# Patient Record
Sex: Female | Born: 1985 | Race: White | Hispanic: No | Marital: Married | Smoking: Never smoker
Health system: Southern US, Community
[De-identification: ages and names within clinical notes are randomized; demographics above are authoritative.]

## PROBLEM LIST (undated history)

## (undated) DIAGNOSIS — J189 Pneumonia, unspecified organism: Secondary | ICD-10-CM

## (undated) HISTORY — PX: WISDOM TOOTH EXTRACTION: SHX21

## (undated) HISTORY — PX: TONSILLECTOMY: SUR1361

---
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
## 2012-12-07 NOTE — L&D Delivery Note (Signed)
Delivery Note At 3:27 PM a viable and healthy female was delivered via  (Presentation:LOA  ).  APGAR: 9, 9; weight pending.   Placenta status: spontaneous and intact.  Cord:  with the following complications: none.  Cord pH: na  Anesthesia:  epidural Episiotomy: none Lacerations: second and left vaginal/vulvar Suture Repair: 2.0 vicryl rapide Est. Blood Loss (mL): 200  Mom to postpartum.  Baby to nursery-stable.  Takiera Mayo J 06/23/2013, 3:40 PM

## 2012-12-21 LAB — OB RESULTS CONSOLE RPR: RPR: NONREACTIVE

## 2012-12-21 LAB — OB RESULTS CONSOLE ANTIBODY SCREEN: Antibody Screen: NEGATIVE

## 2012-12-21 LAB — OB RESULTS CONSOLE ABO/RH: RH Type: POSITIVE

## 2012-12-21 LAB — OB RESULTS CONSOLE HIV ANTIBODY (ROUTINE TESTING): HIV: NONREACTIVE

## 2013-05-26 LAB — OB RESULTS CONSOLE GBS: GBS: POSITIVE

## 2013-06-23 ENCOUNTER — Encounter (HOSPITAL_COMMUNITY): Payer: Self-pay | Admitting: Anesthesiology

## 2013-06-23 ENCOUNTER — Inpatient Hospital Stay (HOSPITAL_COMMUNITY)
Admission: AD | Admit: 2013-06-23 | Discharge: 2013-06-25 | DRG: 775 | Disposition: A | Discharge status: Home / Self Care | Payer: PRIVATE HEALTH INSURANCE | Source: Ambulatory Visit | Attending: Obstetrics and Gynecology | Admitting: Obstetrics and Gynecology

## 2013-06-23 ENCOUNTER — Inpatient Hospital Stay (HOSPITAL_COMMUNITY): Payer: PRIVATE HEALTH INSURANCE | Admitting: Anesthesiology

## 2013-06-23 ENCOUNTER — Encounter (HOSPITAL_COMMUNITY): Payer: Self-pay | Admitting: *Deleted

## 2013-06-23 LAB — RPR: RPR Ser Ql: NONREACTIVE

## 2013-06-23 LAB — CBC
HCT: 38.1 % (ref 36.0–46.0)
Hemoglobin: 12.8 g/dL (ref 12.0–15.0)
RBC: 4.57 MIL/uL (ref 3.87–5.11)

## 2013-06-23 MED ORDER — LACTATED RINGERS IV SOLN: 500.0000 mL | INTRAVENOUS | Status: DC | PRN

## 2013-06-23 MED ORDER — PRENATAL MULTIVITAMIN CH
1.0000 | ORAL_TABLET | Freq: Every day | ORAL | Status: DC
  Administered 2013-06-24 – 2013-06-25 (×2): 1 via ORAL
  Filled 2013-06-23 (×2): qty 1

## 2013-06-23 MED ORDER — ZOLPIDEM TARTRATE 5 MG PO TABS: 5.0000 mg | ORAL_TABLET | Freq: Every evening | ORAL | Status: DC | PRN

## 2013-06-23 MED ORDER — OXYTOCIN BOLUS FROM INFUSION: 500.0000 mL | INTRAVENOUS | Status: DC

## 2013-06-23 MED ORDER — ONDANSETRON HCL 4 MG PO TABS: 4.0000 mg | ORAL_TABLET | ORAL | Status: DC | PRN

## 2013-06-23 MED ORDER — WITCH HAZEL-GLYCERIN EX PADS: 1.0000 "application " | MEDICATED_PAD | CUTANEOUS | Status: DC | PRN

## 2013-06-23 MED ORDER — TETANUS-DIPHTH-ACELL PERTUSSIS 5-2.5-18.5 LF-MCG/0.5 IM SUSP: 0.5000 mL | Freq: Once | INTRAMUSCULAR | Status: DC

## 2013-06-23 MED ORDER — FENTANYL 2.5 MCG/ML BUPIVACAINE 1/10 % EPIDURAL INFUSION (WH - ANES)
14.0000 mL/h | INTRAMUSCULAR | Status: DC | PRN
  Filled 2013-06-23: qty 125

## 2013-06-23 MED ORDER — EPHEDRINE 5 MG/ML INJ
10.0000 mg | INTRAVENOUS | Status: DC | PRN
  Filled 2013-06-23: qty 2
  Filled 2013-06-23: qty 4

## 2013-06-23 MED ORDER — PHENYLEPHRINE 40 MCG/ML (10ML) SYRINGE FOR IV PUSH (FOR BLOOD PRESSURE SUPPORT)
80.0000 ug | PREFILLED_SYRINGE | INTRAVENOUS | Status: DC | PRN
  Filled 2013-06-23: qty 2

## 2013-06-23 MED ORDER — PENICILLIN G POTASSIUM 5000000 UNITS IJ SOLR
2.5000 10*6.[IU] | INTRAVENOUS | Status: DC
  Administered 2013-06-23: 2.5 10*6.[IU] via INTRAVENOUS
  Filled 2013-06-23 (×5): qty 2.5

## 2013-06-23 MED ORDER — PHENYLEPHRINE 40 MCG/ML (10ML) SYRINGE FOR IV PUSH (FOR BLOOD PRESSURE SUPPORT)
80.0000 ug | PREFILLED_SYRINGE | INTRAVENOUS | Status: DC | PRN
  Filled 2013-06-23: qty 2
  Filled 2013-06-23: qty 5

## 2013-06-23 MED ORDER — EPHEDRINE 5 MG/ML INJ
10.0000 mg | INTRAVENOUS | Status: DC | PRN
  Filled 2013-06-23: qty 2

## 2013-06-23 MED ORDER — LACTATED RINGERS IV SOLN
INTRAVENOUS | Status: DC
  Administered 2013-06-23: 07:00:00 via INTRAVENOUS

## 2013-06-23 MED ORDER — LIDOCAINE HCL (PF) 1 % IJ SOLN
INTRAMUSCULAR | Status: DC | PRN
  Administered 2013-06-23 (×2): 4 mL

## 2013-06-23 MED ORDER — ACETAMINOPHEN 325 MG PO TABS: 650.0000 mg | ORAL_TABLET | ORAL | Status: DC | PRN

## 2013-06-23 MED ORDER — DIBUCAINE 1 % RE OINT: 1.0000 "application " | TOPICAL_OINTMENT | RECTAL | Status: DC | PRN

## 2013-06-23 MED ORDER — LACTATED RINGERS IV SOLN
500.0000 mL | Freq: Once | INTRAVENOUS | Status: AC
  Administered 2013-06-23: 500 mL via INTRAVENOUS

## 2013-06-23 MED ORDER — CITRIC ACID-SODIUM CITRATE 334-500 MG/5ML PO SOLN: 30.0000 mL | ORAL | Status: DC | PRN

## 2013-06-23 MED ORDER — IBUPROFEN 600 MG PO TABS
600.0000 mg | ORAL_TABLET | Freq: Four times a day (QID) | ORAL | Status: DC
  Administered 2013-06-24 – 2013-06-25 (×6): 600 mg via ORAL
  Filled 2013-06-23 (×6): qty 1

## 2013-06-23 MED ORDER — SENNOSIDES-DOCUSATE SODIUM 8.6-50 MG PO TABS
2.0000 | ORAL_TABLET | Freq: Every day | ORAL | Status: DC
  Administered 2013-06-23 – 2013-06-24 (×2): 2 via ORAL

## 2013-06-23 MED ORDER — FENTANYL 2.5 MCG/ML BUPIVACAINE 1/10 % EPIDURAL INFUSION (WH - ANES)
INTRAMUSCULAR | Status: DC | PRN
  Administered 2013-06-23: 14 mL/h via EPIDURAL

## 2013-06-23 MED ORDER — PENICILLIN G POTASSIUM 5000000 UNITS IJ SOLR
5.0000 10*6.[IU] | Freq: Once | INTRAVENOUS | Status: AC
  Administered 2013-06-23: 5 10*6.[IU] via INTRAVENOUS
  Filled 2013-06-23: qty 5

## 2013-06-23 MED ORDER — BUTORPHANOL TARTRATE 1 MG/ML IJ SOLN
1.0000 mg | INTRAMUSCULAR | Status: DC | PRN
  Administered 2013-06-23: 1 mg via INTRAVENOUS
  Filled 2013-06-23: qty 1

## 2013-06-23 MED ORDER — DIPHENHYDRAMINE HCL 25 MG PO CAPS: 25.0000 mg | ORAL_CAPSULE | Freq: Four times a day (QID) | ORAL | Status: DC | PRN

## 2013-06-23 MED ORDER — LIDOCAINE HCL (PF) 1 % IJ SOLN
30.0000 mL | INTRAMUSCULAR | Status: DC | PRN
  Filled 2013-06-23 (×2): qty 30

## 2013-06-23 MED ORDER — BENZOCAINE-MENTHOL 20-0.5 % EX AERO
1.0000 "application " | INHALATION_SPRAY | CUTANEOUS | Status: DC | PRN
  Administered 2013-06-24: 1 via TOPICAL
  Filled 2013-06-23: qty 56

## 2013-06-23 MED ORDER — OXYTOCIN 40 UNITS IN LACTATED RINGERS INFUSION - SIMPLE MED
62.5000 mL/h | INTRAVENOUS | Status: DC
  Administered 2013-06-23: 62.5 mL/h via INTRAVENOUS
  Filled 2013-06-23: qty 1000

## 2013-06-23 MED ORDER — FLEET ENEMA 7-19 GM/118ML RE ENEM: 1.0000 | ENEMA | RECTAL | Status: DC | PRN

## 2013-06-23 MED ORDER — IBUPROFEN 600 MG PO TABS
600.0000 mg | ORAL_TABLET | Freq: Four times a day (QID) | ORAL | Status: DC | PRN
  Administered 2013-06-23: 600 mg via ORAL
  Filled 2013-06-23: qty 1

## 2013-06-23 MED ORDER — DIPHENHYDRAMINE HCL 50 MG/ML IJ SOLN: 12.5000 mg | INTRAMUSCULAR | Status: DC | PRN

## 2013-06-23 MED ORDER — OXYCODONE-ACETAMINOPHEN 5-325 MG PO TABS: 1.0000 | ORAL_TABLET | ORAL | Status: DC | PRN

## 2013-06-23 MED ORDER — LANOLIN HYDROUS EX OINT: TOPICAL_OINTMENT | CUTANEOUS | Status: DC | PRN

## 2013-06-23 MED ORDER — METHYLERGONOVINE MALEATE 0.2 MG PO TABS: 0.2000 mg | ORAL_TABLET | ORAL | Status: DC | PRN

## 2013-06-23 MED ORDER — METHYLERGONOVINE MALEATE 0.2 MG/ML IJ SOLN: 0.2000 mg | INTRAMUSCULAR | Status: DC | PRN

## 2013-06-23 MED ORDER — ONDANSETRON HCL 4 MG/2ML IJ SOLN: 4.0000 mg | Freq: Four times a day (QID) | INTRAMUSCULAR | Status: DC | PRN

## 2013-06-23 MED ORDER — SIMETHICONE 80 MG PO CHEW: 80.0000 mg | CHEWABLE_TABLET | ORAL | Status: DC | PRN

## 2013-06-23 MED ORDER — ONDANSETRON HCL 4 MG/2ML IJ SOLN: 4.0000 mg | INTRAMUSCULAR | Status: DC | PRN

## 2013-06-23 NOTE — Anesthesia Procedure Notes (Signed)
Epidural Patient location during procedure: OB Start time: 06/23/2013 8:02 AM  Staffing Anesthesiologist: Labella Zahradnik A. Performed by: anesthesiologist   Preanesthetic Checklist Completed: patient identified, site marked, surgical consent, pre-op evaluation, timeout performed, IV checked, risks and benefits discussed and monitors and equipment checked  Epidural Patient position: sitting Prep: site prepped and draped and DuraPrep Patient monitoring: continuous pulse ox and blood pressure Approach: midline Injection technique: LOR air  Needle:  Needle type: Tuohy  Needle gauge: 17 G Needle length: 9 cm and 9 Needle insertion depth: 4 cm Catheter type: closed end flexible Catheter size: 19 Gauge Catheter at skin depth: 9 cm Test dose: negative and Other  Assessment Events: blood not aspirated, injection not painful, no injection resistance, negative IV test and no paresthesia  Additional Notes Patient identified. Risks and benefits discussed including failed block, incomplete  Pain control, post dural puncture headache, nerve damage, paralysis, blood pressure Changes, nausea, vomiting, reactions to medications-both toxic and allergic and post Partum back pain. All questions were answered. Patient expressed understanding and wished to proceed. Sterile technique was used throughout procedure. Epidural site was Dressed with sterile barrier dressing. No paresthesias, signs of intravascular injection Or signs of intrathecal spread were encountered.  Patient was more comfortable after the epidural was dosed. Please see RN's note for documentation of vital signs and FHR which are stable.

## 2013-06-23 NOTE — Progress Notes (Signed)
Jasmine Larson is a 27 y.o. G1P0 at [redacted]w[redacted]d by LMP admitted for active labor  Subjective: Uncomfortable  Objective: BP 134/63  Pulse 70  Temp(Src) 97.4 F (36.3 C) (Oral)  Resp 20  Ht 5\' 5"  (1.651 m)  Wt 93.441 kg (206 lb)  BMI 34.28 kg/m2  LMP 09/22/2012      FHT:  FHR: 155 bpm, variability: moderate,  accelerations:  Present,  decelerations:  Absent UC:   regular, every 3-5 minutes SVE:   Dilation: 4 Effacement (%): 80 Station: -2 Exam by:: B Mosca  Labs: Lab Results  Component Value Date   WBC 15.0* 06/23/2013   HGB 12.8 06/23/2013   HCT 38.1 06/23/2013   MCV 83.4 06/23/2013   PLT 199 06/23/2013    Assessment / Plan: Spontaneous labor, progressing normally  Labor: Progressing normally Preeclampsia:  labs stable Fetal Wellbeing:  Category I Pain Control:  Labor support without medications I/D:  n/a Anticipated MOD:  NSVD  Jasmine Larson J 06/23/2013, 7:10 AM

## 2013-06-23 NOTE — Anesthesia Preprocedure Evaluation (Signed)
Anesthesia Evaluation  Patient identified by MRN, date of birth, ID band Patient awake    Reviewed: Allergy & Precautions, H&P , NPO status , Patient's Chart, lab work & pertinent test results  Airway Mallampati: III TM Distance: >3 FB Neck ROM: full    Dental no notable dental hx. (+) Teeth Intact   Pulmonary neg pulmonary ROS,  breath sounds clear to auscultation  Pulmonary exam normal       Cardiovascular negative cardio ROS  Rhythm:regular Rate:Normal     Neuro/Psych negative neurological ROS  negative psych ROS   GI/Hepatic Neg liver ROS, GERD-  Medicated and Controlled,  Endo/Other  Obesity   Renal/GU negative Renal ROS  negative genitourinary   Musculoskeletal   Abdominal Normal abdominal exam  (+)   Peds  Hematology negative hematology ROS (+)   Anesthesia Other Findings   Reproductive/Obstetrics (+) Pregnancy                           Anesthesia Physical Anesthesia Plan  ASA: II  Anesthesia Plan: Epidural   Post-op Pain Management:    Induction:   Airway Management Planned:   Additional Equipment:   Intra-op Plan:   Post-operative Plan:   Informed Consent: I have reviewed the patients History and Physical, chart, labs and discussed the procedure including the risks, benefits and alternatives for the proposed anesthesia with the patient or authorized representative who has indicated his/her understanding and acceptance.   Dental Advisory Given  Plan Discussed with: Anesthesiologist  Anesthesia Plan Comments:         Anesthesia Quick Evaluation

## 2013-06-23 NOTE — Progress Notes (Signed)
Jasmine Larson is a 27 y.o. G1P0 at [redacted]w[redacted]d by LMP admitted for active labor  Subjective: Pushing well  Objective: BP 133/72  Pulse 92  Temp(Src) 99.5 F (37.5 C) (Axillary)  Resp 18  Ht 5\' 5"  (1.651 m)  Wt 93.441 kg (206 lb)  BMI 34.28 kg/m2  LMP 09/22/2012      FHT:  FHR: 135 bpm, variability: moderate,  accelerations:  Present,  decelerations:  Absent UC:   regular, every 2-3 minutes SVE:   10/vtx/+2  Labs: Lab Results  Component Value Date   WBC 15.0* 06/23/2013   HGB 12.8 06/23/2013   HCT 38.1 06/23/2013   MCV 83.4 06/23/2013   PLT 199 06/23/2013    Assessment / Plan: Spontaneous labor, progressing normally Second stage , pushing well  Labor: Progressing normally Preeclampsia:  labs stable Fetal Wellbeing:  Category I Pain Control:  Epidural I/D:  n/a Anticipated MOD:  NSVD  Jasmine Larson J 06/23/2013, 1:47 PM

## 2013-06-23 NOTE — H&P (Signed)
NAMELASASHA, BROPHY NO.:  [id number]  MEDICAL RECORD NO.:  [id number]  LOCATION:  9174                          FACILITY:  [facility]  PHYSICIAN:  Lenoard Aden, M.D.DATE OF BIRTH:  [birth year]  DATE OF ADMISSION:  06/23/2013 DATE OF DISCHARGE:                             HISTORY & PHYSICAL   CHIEF COMPLAINT:  Labor.  HISTORY OF PRESENT ILLNESS:  A 27 year old white female, G1, P0, at [redacted] weeks gestation who presents with frequent contractions.  ALLERGIES:  She has no known drug allergies.  MEDICATIONS:  Prenatal vitamins.  Her medications also to include Zantac.  PERSONAL HISTORY:  She has a personal history of celiac disease, Tonsillectomy.  FAMILY HISTORY:  Diabetes.  Her prenatal course has been uncomplicated.  PHYSICAL EXAMINATION:  GENERAL:  She is a well-developed, well-nourished female, in no acute distress. HEENT:  Normal. NECK:  Supple.  Full range of motion. LUNGS:  Clear. HEART:  Regular rhythm. ABDOMEN:  Soft, gravid, nontender.  Cervix per RN, 4, 80% vertex, -2. EXTREMITIES:  There are no cords. NEUROLOGIC:  Nonfocal. SKIN:  Intact.  IMPRESSION:  Term IUP in active labor.  PLAN:  Admit.  Pain management discussed.  Penicillin for GBS prophylaxis.  Anticipate attempts at vaginal delivery.    Lenoard Aden, M.D.    RJT/MEDQ  D:  06/23/2013  T:  06/23/2013  Job:  [id number]

## 2013-06-23 NOTE — MAU Note (Signed)
Pt reports regular uc's since 0230

## 2013-06-23 NOTE — Progress Notes (Signed)
Delivery of live viable female by Dr Taavon. APGARS 9,9  

## 2013-06-24 LAB — CBC
Hemoglobin: 11.4 g/dL — ABNORMAL LOW (ref 12.0–15.0)
MCH: 27.9 pg (ref 26.0–34.0)
MCV: 83.6 fL (ref 78.0–100.0)
Platelets: 213 10*3/uL (ref 150–400)
RBC: 4.09 MIL/uL (ref 3.87–5.11)
WBC: 19.1 10*3/uL — ABNORMAL HIGH (ref 4.0–10.5)

## 2013-06-24 NOTE — Progress Notes (Signed)
PPD #1- SVD  Subjective:   Reports feeling good, a little sore Tolerating po/ No nausea or vomiting Bleeding is light Pain controlled withibuprofen Up ad lib / ambulatory / voiding without problems Newborn breastfeeding     Objective:   VS: BP 119/74  Pulse 87  Temp(Src) 98.3 F (36.8 C) (Oral)  Resp 18  Ht 5\' 5"  (1.651 m)  Wt 93.441 kg (206 lb)  BMI 34.28 kg/m2  LMP 09/22/2012 LABS:  Recent Labs  06/23/13 0635 06/24/13 0610  WBC 15.0* 19.1*  HGB 12.8 11.4*  PLT 199 213               Blood type: A/Positive/-- (01/15 0000)  Rubella:   Immune  I&O: Intake/Output     07/18 0701 - 07/19 0700 07/19 0701 - 07/20 0700   Urine (mL/kg/hr) 1000 (0.4)    Blood 200 (0.1)    Total Output 1200     Net -1200            Physical Exam: Alert and oriented x3 Abdomen: soft, non-tender, non-distended  Fundus: firm, non-tender, U-1 Perineum: sutures approximated, no evidence of reddness or drainage, trace edema Lochia: small Extremities: trace edema to bilateral lower extremeties, no calf pain or tenderness    Assessment:  PPD # 1G1P1001/ S/P:spontaneous vaginal , second degree laceration  Doing well    Plan: Continue routine post partum orders Anticipate D/C home in AM   [facility], Jasmine Larson, N MSN, CNM 06/24/2013, 11:05 AM

## 2013-06-24 NOTE — Anesthesia Postprocedure Evaluation (Signed)
  Anesthesia Post-op Note  Anesthesia Post Note  Patient: Jasmine Larson  Procedure(s) Performed: * No procedures listed *  Anesthesia type: Epidural  Patient location: Mother/Baby  Post pain: Pain level controlled  Post assessment: Post-op Vital signs reviewed  Last Vitals:  Filed Vitals:   06/24/13 0612  BP: 119/74  Pulse: 87  Temp: 36.8 C  Resp: 18    Post vital signs: Reviewed  Level of consciousness:alert  Complications: No apparent anesthesia complications

## 2013-06-25 MED ORDER — EPINEPHRINE TOPICAL FOR CIRCUMCISION 0.1 MG/ML: 1.0000 [drp] | TOPICAL | Status: DC | PRN

## 2013-06-25 MED ORDER — SUCROSE 24% NICU/PEDS ORAL SOLUTION
0.5000 mL | OROMUCOSAL | Status: DC | PRN
  Filled 2013-06-25: qty 0.5

## 2013-06-25 MED ORDER — LIDOCAINE 1%/NA BICARB 0.1 MEQ INJECTION
0.8000 mL | INJECTION | Freq: Once | INTRAVENOUS | Status: DC
  Filled 2013-06-25: qty 1

## 2013-06-25 MED ORDER — ACETAMINOPHEN FOR CIRCUMCISION 160 MG/5 ML
40.0000 mg | ORAL | Status: DC | PRN
  Filled 2013-06-25: qty 2.5

## 2013-06-25 MED ORDER — ACETAMINOPHEN FOR CIRCUMCISION 160 MG/5 ML
40.0000 mg | Freq: Once | ORAL | Status: DC
  Filled 2013-06-25: qty 2.5

## 2013-06-25 MED ORDER — IBUPROFEN 600 MG PO TABS: 600.0000 mg | ORAL_TABLET | Freq: Four times a day (QID) | ORAL | Status: DC | PRN

## 2013-06-25 NOTE — Discharge Summary (Signed)
Obstetric Discharge Summary Reason for Admission: onset of labor Prenatal Procedures: none Intrapartum Procedures: spontaneous vaginal delivery and GBS prophylaxis Postpartum Procedures: none Complications-Operative and Postpartum: 2nd degree vaginal/vulvar laceration Hemoglobin  Date Value Range Status  06/24/2013 11.4* 12.0 - 15.0 g/dL Final     HCT  Date Value Range Status  06/24/2013 34.2* 36.0 - 46.0 % Final    Physical Exam:  General: alert and cooperative Lochia: appropriate Uterine Fundus: firm Incision: healing well, no significant drainage, no dehiscence, no significant erythema DVT Evaluation: No evidence of DVT seen on physical exam. Negative Homan's sign.  Discharge Diagnoses: Term Pregnancy-delivered  Discharge Information: Date: 06/25/2013 Activity: pelvic rest Diet: routine Medications: PNV and Ibuprofen Condition: stable Instructions: refer to practice specific booklet Discharge to: home Follow-up Information   Follow up with Lenoard Aden, MD. Schedule an appointment as soon as possible for a visit in 6 weeks.   Contact information:   [address] [place] [address] [phone]       Newborn Data: Live born female on 06/23/2013 Birth Weight: 8 lb 8 oz (3856 g) APGAR: 9, 9  Home with mother.  Jasmine Larson, N 06/25/2013, 11:36 AM

## 2013-06-25 NOTE — Progress Notes (Signed)
PPD #2- SVD  Subjective:   Reports feeling good, no complaints Tolerating po/ No nausea or vomiting Bleeding is light Pain controlled with Ibuprofen Up ad lib / ambulatory / voiding without problems Newborn breastfeeding  / Circumcision today by Dr. Seymour Bars   Objective:   VS: BP 110/71  Pulse 74  Temp(Src) 98.4 F (36.9 C) (Oral)  Resp 18  Ht 5\' 5"  (1.651 m)  Wt 93.441 kg (206 lb)  BMI 34.28 kg/m2  LMP 09/22/2012  LABS:  Recent Labs  06/23/13 0635 06/24/13 0610  WBC 15.0* 19.1*  HGB 12.8 11.4*  PLT 199 213   Blood type: A/Positive/-- (01/15 0000) Rubella: Immune (12/12 1322)                I&O: Intake/Output     07/19 0701 - 07/20 0700 07/20 0701 - 07/21 0700   Urine (mL/kg/hr)     Blood     Total Output       Net              Physical Exam: Alert and oriented X3 Abdomen: soft, non-tender, non-distended  Fundus: firm, non-tender, U-2 Perineum: approximated, no evidence of redness, edema, or drainage Lochia: small Extremities: trace edema bilateral lower extremeties, no calf pain or tenderness    Assessment: PPD # 2 G1P1001/ S/P:spontaneous vaginal, 2nd degree laceration Doing well - stable for discharge home   Plan: Discharge home RX's:  Ibuprofen 600mg  po Q 6 hrs prn pain #30 Refill x 0  Routine pp visit in 6wks WOB/GYN booklet given    Donette Larry, N MSN, CNM 06/25/2013, 9:40 AM

## 2013-06-27 LAB — RUBELLA ANTIBODY, IGM: Rubella IgM: 0.33 (ref <0.90)

## 2014-10-08 ENCOUNTER — Encounter (HOSPITAL_COMMUNITY): Payer: Self-pay | Admitting: *Deleted

## 2015-09-10 LAB — OB RESULTS CONSOLE RPR: RPR: NONREACTIVE

## 2015-09-10 LAB — OB RESULTS CONSOLE HEPATITIS B SURFACE ANTIGEN: HEP B S AG: NEGATIVE

## 2015-09-10 LAB — OB RESULTS CONSOLE RUBELLA ANTIBODY, IGM: Rubella: IMMUNE

## 2015-09-10 LAB — OB RESULTS CONSOLE HIV ANTIBODY (ROUTINE TESTING): HIV: NONREACTIVE

## 2015-09-19 LAB — OB RESULTS CONSOLE GC/CHLAMYDIA
CHLAMYDIA, DNA PROBE: NEGATIVE
Gonorrhea: NEGATIVE

## 2015-11-26 ENCOUNTER — Other Ambulatory Visit (HOSPITAL_COMMUNITY): Payer: Self-pay | Admitting: Obstetrics

## 2015-11-26 DIAGNOSIS — Z3689 Encounter for other specified antenatal screening: Secondary | ICD-10-CM

## 2015-11-26 DIAGNOSIS — O283 Abnormal ultrasonic finding on antenatal screening of mother: Secondary | ICD-10-CM

## 2015-11-26 DIAGNOSIS — Z3A24 24 weeks gestation of pregnancy: Secondary | ICD-10-CM

## 2015-12-08 DIAGNOSIS — J189 Pneumonia, unspecified organism: Secondary | ICD-10-CM

## 2015-12-08 HISTORY — DX: Pneumonia, unspecified organism: J18.9

## 2015-12-13 ENCOUNTER — Encounter (HOSPITAL_COMMUNITY): Payer: Self-pay

## 2015-12-13 ENCOUNTER — Ambulatory Visit (HOSPITAL_COMMUNITY)
Admission: RE | Admit: 2015-12-13 | Discharge: 2015-12-13 | Disposition: A | Discharge status: Home / Self Care | Payer: Managed Care, Other (non HMO) | Source: Ambulatory Visit | Attending: Obstetrics | Admitting: Obstetrics

## 2015-12-13 DIAGNOSIS — Z3689 Encounter for other specified antenatal screening: Secondary | ICD-10-CM

## 2015-12-13 DIAGNOSIS — O283 Abnormal ultrasonic finding on antenatal screening of mother: Secondary | ICD-10-CM

## 2015-12-13 DIAGNOSIS — Z36 Encounter for antenatal screening of mother: Secondary | ICD-10-CM | POA: Diagnosis not present

## 2015-12-13 DIAGNOSIS — Z3A24 24 weeks gestation of pregnancy: Secondary | ICD-10-CM | POA: Diagnosis not present

## 2015-12-13 DIAGNOSIS — O358XX0 Maternal care for other (suspected) fetal abnormality and damage, not applicable or unspecified: Secondary | ICD-10-CM | POA: Insufficient documentation

## 2015-12-17 ENCOUNTER — Encounter (HOSPITAL_COMMUNITY): Payer: Self-pay

## 2016-03-03 LAB — OB RESULTS CONSOLE GBS: GBS: POSITIVE

## 2016-03-29 ENCOUNTER — Inpatient Hospital Stay (HOSPITAL_COMMUNITY)
Admission: AD | Admit: 2016-03-29 | Discharge: 2016-04-01 | DRG: 765 | Disposition: A | Discharge status: Home / Self Care | Payer: Managed Care, Other (non HMO) | Source: Ambulatory Visit | Attending: Obstetrics | Admitting: Obstetrics

## 2016-03-29 ENCOUNTER — Inpatient Hospital Stay (HOSPITAL_COMMUNITY): Payer: Managed Care, Other (non HMO) | Admitting: Anesthesiology

## 2016-03-29 ENCOUNTER — Encounter (HOSPITAL_COMMUNITY): Payer: Self-pay | Admitting: *Deleted

## 2016-03-29 DIAGNOSIS — O9081 Anemia of the puerperium: Secondary | ICD-10-CM | POA: Diagnosis not present

## 2016-03-29 DIAGNOSIS — O99824 Streptococcus B carrier state complicating childbirth: Secondary | ICD-10-CM | POA: Diagnosis present

## 2016-03-29 DIAGNOSIS — O358XX0 Maternal care for other (suspected) fetal abnormality and damage, not applicable or unspecified: Secondary | ICD-10-CM | POA: Diagnosis present

## 2016-03-29 DIAGNOSIS — Z3A39 39 weeks gestation of pregnancy: Secondary | ICD-10-CM | POA: Diagnosis not present

## 2016-03-29 DIAGNOSIS — D62 Acute posthemorrhagic anemia: Secondary | ICD-10-CM | POA: Diagnosis not present

## 2016-03-29 DIAGNOSIS — Z833 Family history of diabetes mellitus: Secondary | ICD-10-CM

## 2016-03-29 DIAGNOSIS — O324XX0 Maternal care for high head at term, not applicable or unspecified: Secondary | ICD-10-CM | POA: Diagnosis not present

## 2016-03-29 DIAGNOSIS — O3663X0 Maternal care for excessive fetal growth, third trimester, not applicable or unspecified: Secondary | ICD-10-CM | POA: Diagnosis present

## 2016-03-29 LAB — TYPE AND SCREEN
ABO/RH(D): A POS
ANTIBODY SCREEN: NEGATIVE

## 2016-03-29 LAB — CBC
HEMATOCRIT: 39.8 % (ref 36.0–46.0)
Hemoglobin: 13.3 g/dL (ref 12.0–15.0)
MCH: 29 pg (ref 26.0–34.0)
MCHC: 33.4 g/dL (ref 30.0–36.0)
MCV: 86.9 fL (ref 78.0–100.0)
Platelets: 178 10*3/uL (ref 150–400)
RBC: 4.58 MIL/uL (ref 3.87–5.11)
RDW: 14.5 % (ref 11.5–15.5)
WBC: 13.6 10*3/uL — ABNORMAL HIGH (ref 4.0–10.5)

## 2016-03-29 LAB — ABO/RH: ABO/RH(D): A POS

## 2016-03-29 MED ORDER — PENICILLIN G POTASSIUM 5000000 UNITS IJ SOLR
2.5000 10*6.[IU] | INTRAVENOUS | Status: DC
  Administered 2016-03-29 – 2016-03-30 (×2): 2.5 10*6.[IU] via INTRAVENOUS
  Filled 2016-03-29 (×5): qty 2.5

## 2016-03-29 MED ORDER — ONDANSETRON HCL 4 MG/2ML IJ SOLN
4.0000 mg | Freq: Four times a day (QID) | INTRAMUSCULAR | Status: DC | PRN
Start: 2016-03-29 — End: 2016-03-30

## 2016-03-29 MED ORDER — PENICILLIN G POTASSIUM 5000000 UNITS IJ SOLR
5.0000 10*6.[IU] | Freq: Once | INTRAVENOUS | Status: AC
  Administered 2016-03-29: 5 10*6.[IU] via INTRAVENOUS
  Filled 2016-03-29: qty 5

## 2016-03-29 MED ORDER — LACTATED RINGERS IV SOLN
500.0000 mL | Freq: Once | INTRAVENOUS | Status: AC
  Administered 2016-03-29: 500 mL via INTRAVENOUS

## 2016-03-29 MED ORDER — LACTATED RINGERS IV SOLN: 2.5000 [IU]/h | Freq: Once | INTRAVENOUS | Status: DC | PRN

## 2016-03-29 MED ORDER — LIDOCAINE HCL (PF) 1 % IJ SOLN
INTRAMUSCULAR | Status: DC | PRN
  Administered 2016-03-29 (×2): 5 mL via EPIDURAL

## 2016-03-29 MED ORDER — LACTATED RINGERS IV SOLN
500.0000 mL | INTRAVENOUS | Status: DC | PRN
  Administered 2016-03-29: 500 mL via INTRAVENOUS

## 2016-03-29 MED ORDER — LIDOCAINE HCL (PF) 1 % IJ SOLN
30.0000 mL | INTRAMUSCULAR | Status: DC | PRN
  Filled 2016-03-29: qty 30

## 2016-03-29 MED ORDER — OXYTOCIN 10 UNIT/ML IJ SOLN
1.0000 m[IU]/min | INTRAVENOUS | Status: DC
  Administered 2016-03-29: 2 m[IU]/min via INTRAVENOUS
  Filled 2016-03-29: qty 4

## 2016-03-29 MED ORDER — LACTATED RINGERS IV SOLN: 500.0000 mL | Freq: Once | INTRAVENOUS | Status: DC

## 2016-03-29 MED ORDER — PHENYLEPHRINE 40 MCG/ML (10ML) SYRINGE FOR IV PUSH (FOR BLOOD PRESSURE SUPPORT)
80.0000 ug | PREFILLED_SYRINGE | INTRAVENOUS | Status: DC | PRN
  Filled 2016-03-29: qty 20

## 2016-03-29 MED ORDER — FENTANYL 2.5 MCG/ML BUPIVACAINE 1/10 % EPIDURAL INFUSION (WH - ANES)
14.0000 mL/h | INTRAMUSCULAR | Status: DC | PRN
  Administered 2016-03-29 (×2): 14 mL/h via EPIDURAL
  Filled 2016-03-29: qty 125

## 2016-03-29 MED ORDER — OXYCODONE-ACETAMINOPHEN 5-325 MG PO TABS: 2.0000 | ORAL_TABLET | ORAL | Status: DC | PRN

## 2016-03-29 MED ORDER — PHENYLEPHRINE 40 MCG/ML (10ML) SYRINGE FOR IV PUSH (FOR BLOOD PRESSURE SUPPORT): 80.0000 ug | PREFILLED_SYRINGE | INTRAVENOUS | Status: DC | PRN

## 2016-03-29 MED ORDER — LACTATED RINGERS IV SOLN
INTRAVENOUS | Status: DC
  Administered 2016-03-29: 16:00:00 via INTRAVENOUS

## 2016-03-29 MED ORDER — TERBUTALINE SULFATE 1 MG/ML IJ SOLN: 0.2500 mg | Freq: Once | INTRAMUSCULAR | Status: DC | PRN

## 2016-03-29 MED ORDER — OXYTOCIN BOLUS FROM INFUSION: 500.0000 mL | Freq: Once | INTRAVENOUS | Status: DC | PRN

## 2016-03-29 MED ORDER — EPHEDRINE 5 MG/ML INJ: 10.0000 mg | INTRAVENOUS | Status: DC | PRN

## 2016-03-29 MED ORDER — OXYCODONE-ACETAMINOPHEN 5-325 MG PO TABS: 1.0000 | ORAL_TABLET | ORAL | Status: DC | PRN

## 2016-03-29 MED ORDER — ACETAMINOPHEN 325 MG PO TABS: 650.0000 mg | ORAL_TABLET | ORAL | Status: DC | PRN

## 2016-03-29 MED ORDER — DIPHENHYDRAMINE HCL 50 MG/ML IJ SOLN: 12.5000 mg | INTRAMUSCULAR | Status: DC | PRN

## 2016-03-29 MED ORDER — CITRIC ACID-SODIUM CITRATE 334-500 MG/5ML PO SOLN
30.0000 mL | ORAL | Status: DC | PRN
  Administered 2016-03-29 – 2016-03-30 (×2): 30 mL via ORAL
  Filled 2016-03-29 (×2): qty 15

## 2016-03-29 NOTE — Anesthesia Procedure Notes (Signed)
Epidural Patient location during procedure: OB Start time: 03/29/2016 7:35 PM End time: 03/29/2016 7:40 PM  Staffing Anesthesiologist: Ronelle NighEWELL, Joely Losier Performed by: anesthesiologist   Preanesthetic Checklist Completed: patient identified, site marked, surgical consent, pre-op evaluation, timeout performed, IV checked, risks and benefits discussed and monitors and equipment checked  Epidural Patient position: sitting Prep: site prepped and draped and DuraPrep Patient monitoring: continuous pulse ox and blood pressure Approach: midline Location: L3-L4 Injection technique: LOR air  Needle:  Needle type: Tuohy  Needle gauge: 17 G Needle length: 9 cm and 9 Needle insertion depth: 6 cm Catheter type: closed end flexible Catheter size: 19 Gauge Catheter at skin depth: 12 cm Test dose: negative  Assessment Sensory level: T8 Events: blood not aspirated, injection not painful, no injection resistance, negative IV test and no paresthesia  Additional Notes Patient identified. Risks/Benefits/Options discussed with patient including but not limited to bleeding, infection, nerve damage, paralysis, failed block, incomplete pain control, headache, blood pressure changes, nausea, vomiting, reactions to medication both or allergic, itching and postpartum back pain. Confirmed with bedside nurse the patient's most recent platelet count. Confirmed with patient that they are not currently taking any anticoagulation, have any bleeding history or any family history of bleeding disorders. Patient expressed understanding and wished to proceed. All questions were answered. Sterile technique was used throughout the entire procedure. Please see nursing notes for vital signs. Test dose was given through epidural catheter and negative prior to continuing to dose epidural or start infusion. Warning signs of high block given to the patient including shortness of breath, tingling/numbness in hands, complete motor  block, or any concerning symptoms with instructions to call for help. Patient was given instructions on fall risk and not to get out of bed. All questions and concerns addressed with instructions to call with any issues or inadequate analgesia.

## 2016-03-29 NOTE — Progress Notes (Signed)
S: Doing well, no complaints, pain well controlled with epidural  O: BP 122/60 mmHg  Pulse 93  Temp(Src) 98.3 F (36.8 C) (Oral)  Resp 18  Ht 5\' 5"  (1.651 m)  Wt 94.802 kg (209 lb)  BMI 34.78 kg/m2  SpO2 100%  LMP 06/26/2015   FHT:  FHR: 120s bpm, variability: moderate,  accelerations:  Present,  decelerations:  Absent UC:   regular, every 3 minutes SVE:   Dilation: 10 Effacement (%): 100 Station: 0, +1 Exam by:: Cammy CopaJennifer Lineberry, RN    A / P:  29 y.o.  Obstetric History   G2   P1   T1   P0   A0   TAB0   SAB0   E0   M0   L1    at 3365w4d Elective IOL at term, good progress to 10 cm. pushing now x 1.5 hr, still at 0 station, allow 1 hr pasive descent then push again. Concern given LGA  Fetal Wellbeing:  Category I Pain Control:  Epidural  Anticipated MOD:  unclear, expect SVD but lack of descent in this multiparou pt is concerning  Kloie Whiting A. 03/29/2016, 11:33 PM

## 2016-03-29 NOTE — Anesthesia Preprocedure Evaluation (Signed)
Anesthesia Evaluation  Patient identified by MRN, date of birth, ID band Patient awake    Reviewed: Allergy & Precautions, H&P , NPO status , Patient's Chart, lab work & pertinent test results  Airway Mallampati: II  TM Distance: >3 FB Neck ROM: full    Dental no notable dental hx. (+) Dental Advisory Given, Teeth Intact   Pulmonary neg pulmonary ROS,    Pulmonary exam normal breath sounds clear to auscultation       Cardiovascular Exercise Tolerance: Good negative cardio ROS Normal cardiovascular exam Rhythm:regular Rate:Normal     Neuro/Psych negative neurological ROS  negative psych ROS   GI/Hepatic negative GI ROS, Neg liver ROS,   Endo/Other  negative endocrine ROS  Renal/GU negative Renal ROS  negative genitourinary   Musculoskeletal   Abdominal   Peds  Hematology negative hematology ROS (+)   Anesthesia Other Findings   Reproductive/Obstetrics negative OB ROS (+) Pregnancy                             Anesthesia Physical Anesthesia Plan  ASA: II  Anesthesia Plan: Epidural   Post-op Pain Management:    Induction:   Airway Management Planned:   Additional Equipment:   Intra-op Plan:   Post-operative Plan:   Informed Consent: I have reviewed the patients History and Physical, chart, labs and discussed the procedure including the risks, benefits and alternatives for the proposed anesthesia with the patient or authorized representative who has indicated his/her understanding and acceptance.   Dental Advisory Given  Plan Discussed with: CRNA  Anesthesia Plan Comments:         Anesthesia Quick Evaluation  

## 2016-03-29 NOTE — Progress Notes (Signed)
Monitors off for epidural placement. 

## 2016-03-29 NOTE — H&P (Signed)
Jasmine Larson is a 30 y.o. G2P1001 at [redacted]w[redacted]d presenting for elective IOL. Pt notes rare contractions prior to admission . Good fetal movement, No vaginal bleeding, not leaking fluid.  [facility] 10 wks - Dated by sure LMP c/w 10 wk u/s - isolated club foot, [facility] with peds ortho - LGA. 36 wks baby at 8'8/ 99% with AFI 15, AC 99%, HC 97% - DS 135, no 3 hr done, appriate wt gain on low carb diet after this test - PNA in pregnancy - GBS +   Prenatal Transfer Tool  Maternal Diabetes: No Genetic Screening: Normal Maternal Ultrasounds/Referrals: Abnormal:  Findings:   Other: Fetal Ultrasounds or other Referrals:  Referred to Materal Fetal Medicine  Maternal Substance Abuse:  No Significant Maternal Medications:  None Significant Maternal Lab Results: None     OB History    Gravida Para Term Preterm AB TAB SAB Ectopic Multiple Living   2 1 1       1      History reviewed. No pertinent past medical history. Past Surgical History  Procedure Laterality Date  . Tonsillectomy     Family History: family history includes Diabetes in her mother. Social History:  reports that she has never smoked. She has never used smokeless tobacco. She reports that she does not drink alcohol or use illicit drugs.  Review of Systems - Negative except discomfort of preg   Dilation: 4 Effacement (%): 80 Station: -2 Exam by:: Dr. Ernestina PennaFogleman Blood pressure 123/68, pulse 81, temperature 98.5 F (36.9 C), temperature source Oral, resp. rate 20, height 5\' 5"  (1.651 m), weight 94.802 kg (209 lb), last menstrual period 06/26/2015, unknown if currently breastfeeding.  Physical Exam:  Gen: well appearing, no distress Back: no CVAT Abd: gravid, NT, no RUQ pain LE: trace edema, equal bilaterally, non-tender Toco: q 2 min after pit/ AROM FH: baseline 140s, accelerations present, no deceleratons, 10 beat variability  Prenatal labs: ABO, Rh: --/--/A POS (04/23 1551) Antibody:  NEG (04/23 1551) Rubella: !Error!immune RPR:   NR HBsAg:   neg HIV:   neg GBS:   POSITIVE 1 hr Glucola 135  Genetic screening nl NT, nl AFP Anatomy US club foot, isolated   Assessment/Plan: 30 y.o. 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 03/29/2016, 7:26 PM

## 2016-03-30 ENCOUNTER — Encounter (HOSPITAL_COMMUNITY): Payer: Self-pay | Admitting: *Deleted

## 2016-03-30 ENCOUNTER — Encounter (HOSPITAL_COMMUNITY)
Admission: AD | Disposition: A | Discharge status: Home / Self Care | Payer: Self-pay | Source: Ambulatory Visit | Attending: Obstetrics

## 2016-03-30 DIAGNOSIS — O324XX0 Maternal care for high head at term, not applicable or unspecified: Secondary | ICD-10-CM

## 2016-03-30 DIAGNOSIS — O358XX0 Maternal care for other (suspected) fetal abnormality and damage, not applicable or unspecified: Secondary | ICD-10-CM

## 2016-03-30 DIAGNOSIS — O99824 Streptococcus B carrier state complicating childbirth: Secondary | ICD-10-CM

## 2016-03-30 DIAGNOSIS — D62 Acute posthemorrhagic anemia: Secondary | ICD-10-CM

## 2016-03-30 DIAGNOSIS — O9081 Anemia of the puerperium: Secondary | ICD-10-CM

## 2016-03-30 DIAGNOSIS — Z3A39 39 weeks gestation of pregnancy: Secondary | ICD-10-CM

## 2016-03-30 DIAGNOSIS — O3663X0 Maternal care for excessive fetal growth, third trimester, not applicable or unspecified: Secondary | ICD-10-CM

## 2016-03-30 LAB — RPR: RPR Ser Ql: NONREACTIVE

## 2016-03-30 SURGERY — Surgical Case
Anesthesia: Regional | Site: Abdomen

## 2016-03-30 MED ORDER — SCOPOLAMINE 1 MG/3DAYS TD PT72
MEDICATED_PATCH | TRANSDERMAL | Status: AC
Start: 2016-03-30 — End: 2016-03-30
  Filled 2016-03-30: qty 1

## 2016-03-30 MED ORDER — ZOLPIDEM TARTRATE 5 MG PO TABS: 5.0000 mg | ORAL_TABLET | Freq: Every evening | ORAL | Status: DC | PRN

## 2016-03-30 MED ORDER — SIMETHICONE 80 MG PO CHEW: 80.0000 mg | CHEWABLE_TABLET | ORAL | Status: DC | PRN

## 2016-03-30 MED ORDER — CEFAZOLIN SODIUM-DEXTROSE 2-4 GM/100ML-% IV SOLN
INTRAVENOUS | Status: AC
  Filled 2016-03-30: qty 100

## 2016-03-30 MED ORDER — SENNOSIDES-DOCUSATE SODIUM 8.6-50 MG PO TABS
2.0000 | ORAL_TABLET | ORAL | Status: DC
  Administered 2016-03-30 – 2016-04-01 (×2): 2 via ORAL
  Filled 2016-03-30 (×2): qty 2

## 2016-03-30 MED ORDER — PROMETHAZINE HCL 25 MG/ML IJ SOLN
INTRAMUSCULAR | Status: AC
  Filled 2016-03-30: qty 1

## 2016-03-30 MED ORDER — LACTATED RINGERS IV SOLN: INTRAVENOUS | Status: DC

## 2016-03-30 MED ORDER — LACTATED RINGERS IV SOLN
INTRAVENOUS | Status: DC | PRN
  Administered 2016-03-30: 03:00:00 via INTRAVENOUS

## 2016-03-30 MED ORDER — SCOPOLAMINE 1 MG/3DAYS TD PT72
MEDICATED_PATCH | TRANSDERMAL | Status: DC | PRN
  Administered 2016-03-30: 1 via TRANSDERMAL

## 2016-03-30 MED ORDER — LACTATED RINGERS IV SOLN
2.5000 [IU]/h | INTRAVENOUS | Status: AC
  Administered 2016-03-30: 2.5 [IU]/h via INTRAVENOUS
  Filled 2016-03-30: qty 4

## 2016-03-30 MED ORDER — LACTATED RINGERS IV SOLN
INTRAVENOUS | Status: DC
  Administered 2016-03-30 (×2): via INTRAVENOUS

## 2016-03-30 MED ORDER — CEFAZOLIN SODIUM-DEXTROSE 2-3 GM-% IV SOLR
INTRAVENOUS | Status: DC | PRN
  Administered 2016-03-30: 2 g via INTRAVENOUS

## 2016-03-30 MED ORDER — LACTATED RINGERS IV SOLN
40.0000 [IU] | INTRAVENOUS | Status: DC | PRN
  Administered 2016-03-30: 40 [IU] via INTRAVENOUS

## 2016-03-30 MED ORDER — SODIUM BICARBONATE 8.4 % IV SOLN
INTRAVENOUS | Status: AC
Start: 2016-03-30 — End: 2016-03-30
  Filled 2016-03-30: qty 50

## 2016-03-30 MED ORDER — FENTANYL CITRATE (PF) 100 MCG/2ML IJ SOLN: 25.0000 ug | INTRAMUSCULAR | Status: DC | PRN

## 2016-03-30 MED ORDER — LIDOCAINE-EPINEPHRINE (PF) 2 %-1:200000 IJ SOLN
INTRAMUSCULAR | Status: AC
  Filled 2016-03-30: qty 20

## 2016-03-30 MED ORDER — DEXAMETHASONE SODIUM PHOSPHATE 10 MG/ML IJ SOLN
INTRAMUSCULAR | Status: AC
  Filled 2016-03-30: qty 1

## 2016-03-30 MED ORDER — WITCH HAZEL-GLYCERIN EX PADS: 1.0000 "application " | MEDICATED_PAD | CUTANEOUS | Status: DC | PRN

## 2016-03-30 MED ORDER — PROMETHAZINE HCL 25 MG/ML IJ SOLN
INTRAMUSCULAR | Status: DC | PRN
Start: 2016-03-30 — End: 2016-03-30
  Administered 2016-03-30: 6.25 mg via INTRAVENOUS

## 2016-03-30 MED ORDER — MENTHOL 3 MG MT LOZG: 1.0000 | LOZENGE | OROMUCOSAL | Status: DC | PRN

## 2016-03-30 MED ORDER — MORPHINE SULFATE (PF) 0.5 MG/ML IJ SOLN
INTRAMUSCULAR | Status: AC
  Filled 2016-03-30: qty 10

## 2016-03-30 MED ORDER — DIBUCAINE 1 % RE OINT: 1.0000 "application " | TOPICAL_OINTMENT | RECTAL | Status: DC | PRN

## 2016-03-30 MED ORDER — SIMETHICONE 80 MG PO CHEW
80.0000 mg | CHEWABLE_TABLET | Freq: Three times a day (TID) | ORAL | Status: DC
  Administered 2016-03-30 – 2016-04-01 (×7): 80 mg via ORAL
  Filled 2016-03-30 (×7): qty 1

## 2016-03-30 MED ORDER — BUPIVACAINE HCL (PF) 0.5 % IJ SOLN
INTRAMUSCULAR | Status: AC
  Filled 2016-03-30: qty 30

## 2016-03-30 MED ORDER — BUPIVACAINE HCL (PF) 0.5 % IJ SOLN
INTRAMUSCULAR | Status: DC | PRN
  Administered 2016-03-30: 30 mL

## 2016-03-30 MED ORDER — OXYCODONE HCL 5 MG PO TABS: 10.0000 mg | ORAL_TABLET | ORAL | Status: DC | PRN

## 2016-03-30 MED ORDER — DIPHENHYDRAMINE HCL 25 MG PO CAPS: 25.0000 mg | ORAL_CAPSULE | Freq: Four times a day (QID) | ORAL | Status: DC | PRN

## 2016-03-30 MED ORDER — ONDANSETRON HCL 4 MG/2ML IJ SOLN
INTRAMUSCULAR | Status: DC | PRN
  Administered 2016-03-30: 4 mg via INTRAVENOUS

## 2016-03-30 MED ORDER — METHYLERGONOVINE MALEATE 0.2 MG/ML IJ SOLN
INTRAMUSCULAR | Status: DC | PRN
  Administered 2016-03-30: 0.2 mg via INTRAMUSCULAR

## 2016-03-30 MED ORDER — ACETAMINOPHEN 325 MG PO TABS: 650.0000 mg | ORAL_TABLET | ORAL | Status: DC | PRN

## 2016-03-30 MED ORDER — MEPERIDINE HCL 25 MG/ML IJ SOLN
INTRAMUSCULAR | Status: DC | PRN
  Administered 2016-03-30 (×2): 12.5 mg via INTRAVENOUS

## 2016-03-30 MED ORDER — SIMETHICONE 80 MG PO CHEW
80.0000 mg | CHEWABLE_TABLET | ORAL | Status: DC
  Administered 2016-03-30 – 2016-04-01 (×2): 80 mg via ORAL
  Filled 2016-03-30 (×2): qty 1

## 2016-03-30 MED ORDER — METOCLOPRAMIDE HCL 5 MG/ML IJ SOLN
INTRAMUSCULAR | Status: DC | PRN
  Administered 2016-03-30: 10 mg via INTRAVENOUS

## 2016-03-30 MED ORDER — TETANUS-DIPHTH-ACELL PERTUSSIS 5-2.5-18.5 LF-MCG/0.5 IM SUSP: 0.5000 mL | Freq: Once | INTRAMUSCULAR | Status: DC

## 2016-03-30 MED ORDER — ONDANSETRON HCL 4 MG/2ML IJ SOLN
INTRAMUSCULAR | Status: AC
  Filled 2016-03-30: qty 2

## 2016-03-30 MED ORDER — OXYTOCIN 10 UNIT/ML IJ SOLN
INTRAMUSCULAR | Status: AC
  Filled 2016-03-30: qty 4

## 2016-03-30 MED ORDER — BUPIVACAINE HCL (PF) 0.25 % IJ SOLN
INTRAMUSCULAR | Status: AC
  Filled 2016-03-30: qty 30

## 2016-03-30 MED ORDER — METOCLOPRAMIDE HCL 5 MG/ML IJ SOLN
INTRAMUSCULAR | Status: AC
  Filled 2016-03-30: qty 2

## 2016-03-30 MED ORDER — SODIUM BICARBONATE 8.4 % IV SOLN
INTRAVENOUS | Status: DC | PRN
  Administered 2016-03-30 (×3): 5 mL via EPIDURAL

## 2016-03-30 MED ORDER — MORPHINE SULFATE (PF) 0.5 MG/ML IJ SOLN
INTRAMUSCULAR | Status: DC | PRN
  Administered 2016-03-30: 4 mg via EPIDURAL

## 2016-03-30 MED ORDER — OXYCODONE HCL 5 MG PO TABS: 5.0000 mg | ORAL_TABLET | ORAL | Status: DC | PRN

## 2016-03-30 MED ORDER — COCONUT OIL OIL: 1.0000 "application " | TOPICAL_OIL | Status: DC | PRN

## 2016-03-30 MED ORDER — MEPERIDINE HCL 25 MG/ML IJ SOLN
INTRAMUSCULAR | Status: AC
  Filled 2016-03-30: qty 1

## 2016-03-30 MED ORDER — IBUPROFEN 600 MG PO TABS
600.0000 mg | ORAL_TABLET | Freq: Four times a day (QID) | ORAL | Status: DC
  Administered 2016-03-30 – 2016-04-01 (×8): 600 mg via ORAL
  Filled 2016-03-30 (×8): qty 1

## 2016-03-30 MED ORDER — PRENATAL MULTIVITAMIN CH
1.0000 | ORAL_TABLET | Freq: Every day | ORAL | Status: DC
  Administered 2016-03-30 – 2016-03-31 (×2): 1 via ORAL
  Filled 2016-03-30 (×2): qty 1

## 2016-03-30 MED ORDER — METHYLERGONOVINE MALEATE 0.2 MG/ML IJ SOLN
INTRAMUSCULAR | Status: AC
  Filled 2016-03-30: qty 1

## 2016-03-30 MED ORDER — LACTATED RINGERS IV SOLN
INTRAVENOUS | Status: DC | PRN
  Administered 2016-03-30 (×2): via INTRAVENOUS

## 2016-03-30 MED ORDER — DEXAMETHASONE SODIUM PHOSPHATE 10 MG/ML IJ SOLN
INTRAMUSCULAR | Status: DC | PRN
  Administered 2016-03-30: 10 mg via INTRAVENOUS

## 2016-03-30 SURGICAL SUPPLY — 35 items
CHLORAPREP W/TINT 26ML (MISCELLANEOUS) ×2 IMPLANT
CLAMP CORD UMBIL (MISCELLANEOUS) IMPLANT
CLOTH BEACON ORANGE TIMEOUT ST (SAFETY) ×2 IMPLANT
CONTAINER PREFILL 10% NBF 15ML (MISCELLANEOUS) IMPLANT
DRSG OPSITE POSTOP 4X10 (GAUZE/BANDAGES/DRESSINGS) ×2 IMPLANT
DRSG PAD ABDOMINAL 8X10 ST (GAUZE/BANDAGES/DRESSINGS) ×2 IMPLANT
ELECT REM PT RETURN 9FT ADLT (ELECTROSURGICAL) ×2
ELECTRODE REM PT RTRN 9FT ADLT (ELECTROSURGICAL) ×1 IMPLANT
EXTRACTOR VACUUM M CUP 4 TUBE (SUCTIONS) IMPLANT
GLOVE BIO SURGEON STRL SZ7.5 (GLOVE) ×2 IMPLANT
GLOVE BIOGEL PI IND STRL 7.0 (GLOVE) ×2 IMPLANT
GLOVE BIOGEL PI INDICATOR 7.0 (GLOVE) ×2
GOWN STRL REUS W/TWL LRG LVL3 (GOWN DISPOSABLE) ×6 IMPLANT
HEMOSTAT SURGICEL 2X3 (HEMOSTASIS) ×2 IMPLANT
KIT ABG SYR 3ML LUER SLIP (SYRINGE) IMPLANT
NEEDLE HYPO 22GX1.5 SAFETY (NEEDLE) ×2 IMPLANT
NEEDLE HYPO 25X5/8 SAFETYGLIDE (NEEDLE) IMPLANT
NEEDLE SPNL 20GX3.5 QUINCKE YW (NEEDLE) IMPLANT
NS IRRIG 1000ML POUR BTL (IV SOLUTION) ×2 IMPLANT
PACK C SECTION WH (CUSTOM PROCEDURE TRAY) ×2 IMPLANT
PENCIL SMOKE EVAC W/HOLSTER (ELECTROSURGICAL) ×2 IMPLANT
RTRCTR C-SECT PINK 25CM LRG (MISCELLANEOUS) ×2 IMPLANT
SUT MNCRL 0 VIOLET CTX 36 (SUTURE) ×3 IMPLANT
SUT MNCRL AB 3-0 PS2 27 (SUTURE) IMPLANT
SUT MON AB 2-0 CT1 27 (SUTURE) ×2 IMPLANT
SUT MON AB-0 CT1 36 (SUTURE) ×4 IMPLANT
SUT MONOCRYL 0 CTX 36 (SUTURE) ×3
SUT PLAIN 0 NONE (SUTURE) IMPLANT
SUT PLAIN 2 0 (SUTURE)
SUT PLAIN 2 0 XLH (SUTURE) IMPLANT
SUT PLAIN ABS 2-0 CT1 27XMFL (SUTURE) IMPLANT
SYR 20CC LL (SYRINGE) IMPLANT
SYR CONTROL 10ML LL (SYRINGE) ×2 IMPLANT
TOWEL OR 17X24 6PK STRL BLUE (TOWEL DISPOSABLE) ×2 IMPLANT
TRAY FOLEY CATH SILVER 14FR (SET/KITS/TRAYS/PACK) ×2 IMPLANT

## 2016-03-30 NOTE — Brief Op Note (Signed)
03/29/2016 - 03/30/2016  3:28 AM  PATIENT:  Jasmine Larson  30 y.o. female  PRE-OPERATIVE DIAGNOSIS:  cesarean section arrest of descent  POST-OPERATIVE DIAGNOSIS:  cesarean section arrest of descent  PROCEDURE:  Procedure(s): CESAREAN SECTION (N/A)  SURGEON:  Surgeon(s) and Role:       * Tereso NewcomerUgonna A Anyanwu, MD   PHYSICIAN ASSISTANT:   ASSISTANTS: none   ANESTHESIA:   local and epidural  EBL:  Total I/O In: 1000 [I.V.:1000] Out: 850 [Urine:150; Blood:700]  BLOOD ADMINISTERED:none  DRAINS: Urinary Catheter (Foley)   LOCAL MEDICATIONS USED:  BUPIVICAINE  and Amount: 30 ml  SPECIMEN:  Source of Specimen:  placenta  DISPOSITION OF SPECIMEN:  L&D  COUNTS:  YES  TOURNIQUET:  * No tourniquets in log *  DICTATION: .Note written in EPIC  PLAN OF CARE: Admit to inpatient   PATIENT DISPOSITION:  PACU - hemodynamically stable.   Delay start of Pharmacological VTE agent (>24hrs) due to surgical blood loss or risk of bleeding: yes

## 2016-03-30 NOTE — Addendum Note (Signed)
Addendum  created 03/30/16 1556 by Algis GreenhouseLinda A Reena Borromeo, CRNA   Modules edited: Clinical Notes   Clinical Notes:  File: [id number]

## 2016-03-30 NOTE — Anesthesia Postprocedure Evaluation (Signed)
Anesthesia Post Note  Patient: Jasmine Larson  Procedure(s) Performed: Procedure(s) (LRB): CESAREAN SECTION (N/A)  Patient location during evaluation: PACU Anesthesia Type: Epidural Level of consciousness: awake and alert Pain management: pain level controlled Vital Signs Assessment: post-procedure vital signs reviewed and stable Respiratory status: spontaneous breathing, nonlabored ventilation, respiratory function stable and patient connected to nasal cannula oxygen Cardiovascular status: blood pressure returned to baseline and stable Postop Assessment: no signs of nausea or vomiting Anesthetic complications: no    Last Vitals:  Filed Vitals:   03/30/16 0356 03/30/16 0400  BP: 120/54 105/84  Pulse: 90 82  Temp:    Resp: 22 21    Last Pain:  Filed Vitals:   03/30/16 0406  PainSc: 0-No pain                 Katrin Grabel L

## 2016-03-30 NOTE — Progress Notes (Signed)
Informed consent obtained from mom including discussion of medical necessity, cannot guarantee cosmetic outcome, risk of incomplete procedure due to diagnosis of urethral abnormalities, risk of bleeding and infection. 0.8cc 1% lidocaine infused to dorsal penile nerve after sterile prep and drape. Uncomplicated circumcision done with 1.45 Gomco. Hemostasis with Gelfoam. Tolerated well, minimal blood loss.   Noland FordyceFOGLEMAN,Charita Lindenberger A. MD 03/30/2016 1:45 PM

## 2016-03-30 NOTE — Op Note (Signed)
3:28 AM  PATIENT:  Jasmine Larson  30 y.o. female  PRE-OPERATIVE DIAGNOSIS:  cesarean section arrest of descent  POST-OPERATIVE DIAGNOSIS:  cesarean section arrest of descent  PROCEDURE:  Procedure(s): CESAREAN SECTION (N/A)  SURGEON:  Surgeon(s) and Role:       * Tereso NewcomerUgonna A Anyanwu, MD   PHYSICIAN ASSISTANT:   ASSISTANTS: none   ANESTHESIA:   local and epidural  EBL:  Total I/O In: 1000 [I.V.:1000] Out: 850 [Urine:150; Blood:700]  BLOOD ADMINISTERED:none  DRAINS: Urinary Catheter (Foley)   LOCAL MEDICATIONS USED:  BUPIVICAINE  and Amount: 30 ml  SPECIMEN:  Source of Specimen:  placenta  DISPOSITION OF SPECIMEN:  L&D  COUNTS:  YES  TOURNIQUET:  * No tourniquets in log *  DICTATION: .Note written in EPIC  PLAN OF CARE: Admit to inpatient   PATIENT DISPOSITION:  PACU - hemodynamically stable.   Delay start of Pharmacological VTE agent (>24hrs) due to surgical blood loss or risk of bleeding: yes    Findings:  @BABYSEXEBC @ infant,  APGAR (1 MIN): 8   APGAR (5 MINS): 9   APGAR (10 MINS):   Normal uterus, tubes and ovaries, normal placenta. 3VC, clear amniotic fluid  EBL: 700 cc Antibiotics:   2g Ancef Complications: none  Indications: This is a 30 y.o. year-old, G2P1  At 8654w5d admitted for elective IOL. Pt has onset of active labor shortly after AROM. Also with pitocin. Great progress to full dilation. Pt then allowed passive descent for 1 hr, pushed for 1.5 hrs but did not pass the 0 station. Another 1 hr of passive descent with maternal rotation but head did not pass the 0 station. Pt then pushed 1 more hour, in different positions with no descent past 0 station.  Risks benefits and alternatives of the procedure were discussed with the patient who agreed to proceed  Procedure:  After informed consent was obtained the patient was taken to the operating room where epidural anesthesia was found to be adequate.  She was prepped and draped in the normal sterile  fashion in dorsal supine lithotomy position.  A foley catheter was in place and draining well.  A Pfannenstiel skin incision was made 2 cm above the pubic symphysis in the midline with the scalpel.  Dissection was carried down bluntly and sharply. The fascia was incised in the midline. The incision was extended laterally with the Mayo scissors. The inferior aspect of the fascial incision was grasped with the Coker clamps, elevated up and the underlying rectus muscles were dissected off bluntly and sharply. The superior aspect of the fascial incision was grasped with the Coker clamps elevated up and the underlying rectus muscles were dissected off bluntly and sharply.  The peritoneum was entered bluntly. The peritoneal incision was extended superiorly and inferiorly with good visualization of the bladder. The bladder blade was inserted and palpation was done to assess the fetal position and the location of the uterine vessels. An Alexis self retaining retractor was placed. The lower segment of the uterus was incised sharply with the scalpel and extended  bluntly in the cephalo-caudal fashion. The infant was grasped, brought to the incision,  rotated and the infant was delivered with fundal pressure. The nose and mouth were bulb suctioned. The cord was clamped and cut after 1 minute of delay. The infant was handed off to the waiting pediatrician. The placenta was expressed. The uterus was left in situ. The uterus was cleared of all clots and debris. The uterine incision was repaired  with 0 Vicryl in a running locked fashion.  A second layer of the same suture was used in an imbricating fashion to obtain excellent hemostasis. An additional figure of 8 was placed just to the right of midline for hemostasis. The tubes and ovaries were visualized and normal. The gutters were cleared of all clots and debris. The uterine incision was reinspected and found to be hemostatic. A small amount of bleeding was noted from the  bladder flap and a piece of surgicel was placed.  The peritoneum was grasped and closed with 2-0 Vicryl in a running fashion. The cut muscle edges and the underside of the fascia were inspected and found to be hemostatic. The fascia was closed with 0 Vicryl in a single layer. The subcutaneous tissue was irrigated.  The skin was closed with a 4-0 Vicryl on a Keith needle. The patient tolerated the procedure well. Sponge lap and needle counts were correct x3 and patient was taken to the recovery room in a stable condition.  Burnette Sautter A. 03/30/2016 3:29 AM  Present but not scrubbed for the entire surgery. Surgery performed by Dr Macon Large

## 2016-03-30 NOTE — Anesthesia Postprocedure Evaluation (Signed)
Anesthesia Post Note  Patient: Jasmine Larson  Procedure(s) Performed: Procedure(s) (LRB): CESAREAN SECTION (N/A)  Patient location during evaluation: Mother Baby Anesthesia Type: Epidural Level of consciousness: awake Pain management: satisfactory to patient Vital Signs Assessment: post-procedure vital signs reviewed and stable Respiratory status: spontaneous breathing Cardiovascular status: stable Anesthetic complications: no    Last Vitals:  Filed Vitals:   03/30/16 1241 03/30/16 1251  BP: 109/97 118/63  Pulse: 76   Temp: 36.9 C   Resp: 18     Last Pain:  Filed Vitals:   03/30/16 1251  PainSc: 3                  Billijo Dilling

## 2016-03-30 NOTE — Progress Notes (Signed)
S: Doing well, no complaints, pain well controlled with epidural  O: BP 100/62 mmHg  Pulse 90  Temp(Src) 98.3 F (36.8 C) (Oral)  Resp 18  Ht 5\' 5"  (1.651 m)  Wt 94.802 kg (209 lb)  BMI 34.78 kg/m2  SpO2 100%  LMP 06/26/2015   FHT:  FHR: 120s bpm, variability: moderate,  accelerations:  Present,  decelerations:  Absent UC:   regular, every 2 minutes SVE:   Dilation: 10 Effacement (%): 100 Station: 0 Exam by:: Dr. Ernestina PennaFogleman   A / P:  29 y.o.  Obstetric History   G2   P1   T1   P0   A0   TAB0   SAB0   E0   M0   L1    at 5135w5d Arrest of decent  Fetal Wellbeing:  Category I Pain Control:  Epidural  Anticipated MOD:  Plan PCS due to arrest of descent. R/B of c/s d/w pt who agrees to proceed. Pt aware Dr Macon LargeAnyanwu will be doing surgery.   Mycheal Veldhuizen A. 03/30/2016, 2:06 AM

## 2016-03-30 NOTE — Transfer of Care (Signed)
Immediate Anesthesia Transfer of Care Note  Patient: Jasmine Larson  Procedure(s) Performed: Procedure(s): CESAREAN SECTION (N/A)  Patient Location: PACU  Anesthesia Type:Epidural  Level of Consciousness: awake  Airway & Oxygen Therapy: Patient Spontanous Breathing  Post-op Assessment: Report given to RN and Post -op Vital signs reviewed and stable  Post vital signs: stable  Last Vitals:  Filed Vitals:   03/30/16 0348 03/30/16 0350  BP:    Pulse: 40 87  Temp:  37.4 C  Resp: 16 21    Complications: No apparent anesthesia complications

## 2016-03-30 NOTE — Progress Notes (Signed)
Patient betadine nasal swab completed.

## 2016-03-30 NOTE — Addendum Note (Signed)
Addendum  created 03/30/16 0416 by Ronelle Nighharles Rhapsody Wolven, MD   Modules edited: Anesthesia Attestations

## 2016-03-30 NOTE — Lactation Note (Signed)
This note was copied from a baby's chart. Lactation Consultation Note  Patient Name: Jasmine Larson RUEAV'WToday's Date: 03/30/2016 Reason for consult: Initial assessment   With this mom of a 10 pound term baby, now 10 hours old, and in the CNS for a circumcision. Mom has falt nipples, and may try and latch this baby, but is leaning toward pumping and bottle feeding. She pumped and bottle fed her first for 12 months, and had an abundant supply of milk. Mom has hand expressed 1-2 ml's of very thick, yellow colostrum. She will call for help with feeding this to the baby, if needed. I gave her a spoon, and a curved tip syringe. Basic teaching on breastfeeding reviewed from baby and me book, and lactation services reviewed also. Mom knows to call for questions/concerns.    Maternal Data Formula Feeding for Exclusion: No Has patient been taught Hand Expression?: Yes Does the patient have breastfeeding experience prior to this delivery?: Yes  Feeding Feeding Type: Breast Fed Length of feed: 2 min  LATCH Score/Interventions Latch:  (baby in CNS for circ)  Audible Swallowing:  (easily expressed thick colostrum - mom with great technique)  Type of Nipple: Flat (one nipple inverted, both flat, some eversion with stimulation) Intervention(s): Double electric pump  Comfort (Breast/Nipple): Soft / non-tender           Lactation Tools Discussed/Used Pump Review: Setup, frequency, and cleaning;Milk Storage;Other (comment) Initiated by:: Jasmine Reaponna Esker, RN Date initiated:: 03/30/16   Consult Status Consult Status: Follow-up Date: 03/31/16 Follow-up type: In-patient    Alfred LevinsLee, Delinda Malan Anne 03/30/2016, 1:35 PM

## 2016-03-31 ENCOUNTER — Encounter (HOSPITAL_COMMUNITY): Payer: Self-pay | Admitting: Obstetrics and Gynecology

## 2016-03-31 DIAGNOSIS — D62 Acute posthemorrhagic anemia: Secondary | ICD-10-CM | POA: Diagnosis not present

## 2016-03-31 LAB — CBC
HEMATOCRIT: 27.5 % — AB (ref 36.0–46.0)
HEMOGLOBIN: 9.2 g/dL — AB (ref 12.0–15.0)
MCH: 29.1 pg (ref 26.0–34.0)
MCHC: 33.5 g/dL (ref 30.0–36.0)
MCV: 87 fL (ref 78.0–100.0)
Platelets: 149 10*3/uL — ABNORMAL LOW (ref 150–400)
RBC: 3.16 MIL/uL — ABNORMAL LOW (ref 3.87–5.11)
RDW: 14.9 % (ref 11.5–15.5)
WBC: 14.8 10*3/uL — AB (ref 4.0–10.5)

## 2016-03-31 LAB — BIRTH TISSUE RECOVERY COLLECTION (PLACENTA DONATION)

## 2016-03-31 NOTE — Lactation Note (Addendum)
This note was copied from a baby's chart. Lactation Consultation Note Mom upset and tearful d/t worried she isn't producing enough. Mom is pumping and bottle feeding. She will put the baby to the breast occasionally. Mom has flat nipples, nipples are compressible and roll we in fingertips to define boarders of nipples. Very compressible for baby to probably latch. Mom stated that baby has latched great a few times but it is hard and she doesn't want to use a NS and shells like she had to TRY to do for her now 253 yr old. Mom is going to pump and bottle feed. Mom was tearful because she felt that she should be filling up a bottle like she did before. Mom states she has been able to hand express milk since her last child and that why she can't understand why she can't more out than she is. I hand expressed breast and collected 2 ml colostrum. Mom is pumping q3 hr. Baby is a 9.10 B baby and has been a little fussy and mom felt like the baby wasn't getting enough. Mom asked for formula to supplement with, and felt bad over doing that. Discussed supply and demand, STS, I&O, pumping, newborn behavior, and thickness of colostrum. Mom felt better before leaving the room. Thanked me for not pressuring her to put baby to the breast. Explained to mom the baby is getting the colostrum, and you are happy that is what is important.  Patient Name: Boy Harvie Junioraylor Nova RUEAV'WToday's Date: 03/31/2016 Reason for consult: Follow-up assessment   Maternal Data    Feeding Feeding Type: Bottle Fed - Formula Nipple Type: Slow - flow  LATCH Score/Interventions                      Lactation Tools Discussed/Used     Consult Status Consult Status: Follow-up Date: 03/31/16 Follow-up type: In-patient    Linn Goetze, Diamond NickelLAURA G 03/31/2016, 1:42 AM

## 2016-03-31 NOTE — Lactation Note (Signed)
This note was copied from a baby's chart. Lactation Consultation Note  Patient Name: Jasmine Larson VHQIO'NToday's Date: 03/31/2016 Reason for consult: Follow-up assessment Baby at 39 hr of life and mom does want to try latching baby. She denies breast or nipple pain. She stated that she has 1 inverted nipple and 1 flat nipple that have gotten more erect over the time she pumped and bf her older child. Both nipples appeared erect, R a little longer then L when lactation observed breast, but mom had just finished DEBP. She had a bad experience with lactation 3 yr ago and came to the hospital with the plan to pump to feed to avoid another bad experience. Baby was rooting so she tried latching and most of the time it goes well. Answered questions about NS. Demonstrated to both parents how to compress the breast then guide baby on for a deeper latch. Mom will offer the breast at every cue, if baby is having trouble latching she will let FOB sooth baby while she pumps then he can feed her pumped milk. If baby is latching well she will not pump unless she is having symptoms of engorgement. She is aware of lactation services and support group. She will call as needed for bf support.   Maternal Data    Feeding Feeding Type: Breast Fed  LATCH Score/Interventions Latch: Repeated attempts needed to sustain latch, nipple held in mouth throughout feeding, stimulation needed to elicit sucking reflex. Intervention(s): Adjust position;Assist with latch;Breast compression  Audible Swallowing: Spontaneous and intermittent Intervention(s): Skin to skin;Hand expression;Alternate breast massage  Type of Nipple: Flat Intervention(s): No intervention needed  Comfort (Breast/Nipple): Soft / non-tender     Hold (Positioning): Assistance needed to correctly position infant at breast and maintain latch. Intervention(s): Support Pillows;Position options  LATCH Score: 7  Lactation Tools Discussed/Used     Consult  Status Consult Status: PRN    Rulon Eisenmengerlizabeth E Donaven Criswell 03/31/2016, 6:31 PM

## 2016-03-31 NOTE — Progress Notes (Signed)
Patient ID: Jasmine Larson, female   DOB: 04/05/1986, 30 y.o.   MRN: [id number] Subjective: S/P Cesarean Delivery d/t Arrest of Descent POD# 1 Information for the patient's newborn:  Clint Lippsugh, Boy Alek [[id number]][[id number]]  female  / circ planning  Reports feeling very tired, but well. Feeding: breast and bottle Patient reports tolerating PO.  Breast symptoms: little to no colostrum production Pain controlled with ibuprofen (OTC) and narcotic analgesics including Percocet Denies HA/SOB/C/P/N/V/dizziness. Flatus present. No BM. She reports vaginal bleeding as normal, without clots.  She is ambulating, Foley just d/c'd at 0600, but not urinating on her own yet.     Objective:   VS:  Filed Vitals:   03/30/16 1719 03/30/16 2120 03/31/16 0150 03/31/16 0528  BP: 118/63 115/58 117/60 98/50  Pulse: 77 81 67 65  Temp: 98.1 F (36.7 C) 98.3 F (36.8 C) 97.9 F (36.6 C) 97.8 F (36.6 C)  TempSrc: Oral Oral Oral Oral  Resp: 18 18 20 18   Height:      Weight:      SpO2: 98% 98% 97%      Intake/Output Summary (Last 24 hours) at 03/31/16 0840 Last data filed at 03/31/16 0505  Gross per 24 hour  Intake   3400 ml  Output   3200 ml  Net    200 ml        Recent Labs  03/29/16 1550 03/31/16 0553  WBC 13.6* 14.8*  HGB 13.3 9.2*  HCT 39.8 27.5*  PLT 178 149*     Blood type: A POS (04/23 1551)  Rubella: Immune (10/04 0000)     Physical Exam:   General: alert, cooperative and no distress  CV: Regular rate and rhythm, S1S2 present or without murmur or extra heart sounds  Resp: clear  Abdomen: soft, nontender, normal bowel sounds  Incision: serous drainage present on lower aspect of pressure dressing; dressing intact  Uterine Fundus: firm, 1 FB below umbilicus, nontender  Lochia: minimal  Ext: extremities normal, atraumatic, no cyanosis or edema, Homans sign is negative, no sign of DVT and no edema, redness or tenderness in the calves or thighs   Assessment/Plan: 30 y.o.   POD# 1.  S/P Cesarean  Delivery.  Indications: arrest of descent                Principal Problem:   Postpartum care following cesarean delivery (4/24) Active Problems:   Labor and delivery, indication for care   Postoperative anemia due to acute blood loss  Doing well, stable.               Regular diet as tolerated D/C IV per unit protocol Ambulate Routine post-op care Concerned early discharge home tomorrow  Raelyn MoraDAWSON, Jasmine Larson, M, MSN, CNM 03/31/2016, 8:40 AM

## 2016-04-01 MED ORDER — IBUPROFEN 600 MG PO TABS: 600.0000 mg | ORAL_TABLET | Freq: Four times a day (QID) | ORAL | Status: DC

## 2016-04-01 MED ORDER — OXYCODONE HCL 5 MG PO TABS: 5.0000 mg | ORAL_TABLET | ORAL | Status: DC | PRN

## 2016-04-01 NOTE — Discharge Summary (Signed)
POSTOPERATIVE DISCHARGE SUMMARY:  Patient ID: Jasmine Larson MRN: [id number] DOB/AGE: December 11, 1985 30 y.o.  Admit date: 03/29/2016 Admission Diagnoses: 39.5 weeks  / elective IOL  Discharge date:  04/01/2016 Discharge Diagnoses: POD 2 s/p CS arrest of descent  Prenatal history: G2P2002   EDC : 04/01/2016, by Last Menstrual Period  [facility]  Primary provider : Ernestina PennaFogleman Prenatal course uncomplicated  Prenatal Labs: ABO, Rh: --/--/A POS (04/23 1551)  Antibody: NEG (04/23 1551) Rubella: Immune (10/04 0000)  RPR: Non Reactive (04/23 1550)  HBsAg: Negative (10/04 0000)  HIV: Non-reactive (10/04 0000)  GBS: Positive (03/28 0000)   Medical / Surgical History :  Past medical history: History reviewed. No pertinent past medical history.  Past surgical history:  Past Surgical History  Procedure Laterality Date  . Tonsillectomy    . Cesarean section N/A 03/30/2016    Procedure: CESAREAN SECTION;  Surgeon: Olivia Mackieichard Taavon, MD;  Location: [facility];  Service: Obstetrics;  Laterality: N/A;    Family History:  Family History  Problem Relation Age of Onset  . Diabetes Mother     Social History:  reports that she has never smoked. She has never used smokeless tobacco. She reports that she does not drink alcohol or use illicit drugs.  Allergies: Review of patient's allergies indicates no known allergies.   Current Medications at time of admission:  Prior to Admission medications   Medication Sig Start Date End Date Taking? Authorizing Provider  Prenatal Vit-Fe Fumarate-FA (PRENATAL MULTIVITAMIN) TABS Take 1 tablet by mouth at bedtime.    Yes Historical Provider, MD  ranitidine (ZANTAC) 150 MG tablet Take 150 mg by mouth at bedtime as needed for heartburn.   Yes Historical Provider, MD   Intrapartum Course:  Admit for elective induction of labor with labor progression to 10 dilation with normal labor curve and arrest of descent at 0 station Pain  management: epidural Complicated by: arrest of descent Interventions required: cesarean section  Procedures: Cesarean section delivery on 03/30/2016  with delivery of  viable newborn by Dr Greig CastillaAnywana (Faculty Practice) for Dr Ernestina PennaFogleman   See operative report for further details APGAR (1 MIN): 8   APGAR (5 MINS): 9    Postoperative / postpartum course:  Uncomplicated with discharge on POD 2   Discharge Instructions:  Discharged Condition: stable  Activity: pelvic rest and postoperative restrictions x 2   Diet: routine  Medications:    Medication List    TAKE these medications        ibuprofen 600 MG tablet  Commonly known as:  ADVIL,MOTRIN  Take 1 tablet (600 mg total) by mouth every 6 (six) hours.     oxyCODONE 5 MG immediate release tablet  Commonly known as:  Oxy IR/ROXICODONE  Take 1 tablet (5 mg total) by mouth every 4 (four) hours as needed (pain scale 4-7).     prenatal multivitamin Tabs tablet  Take 1 tablet by mouth at bedtime.     ranitidine 150 MG tablet  Commonly known as:  ZANTAC  Take 150 mg by mouth at bedtime as needed for heartburn.        Wound Care: keep clean and dry / remove honeycomb POD 5 Postpartum Instructions: [facility] discharge booklet - instructions reviewed  Discharge to: Home  Follow up :   [facility] in 6 weeks for routine postpartum visit with DR Ernestina PennaFogleman                Signed: Marlinda MikeBAILEY, TANYA CNM, MSN,  FACNM 04/01/2016, 8:54 AM

## 2016-04-01 NOTE — Progress Notes (Signed)
POSTOPERATIVE DAY # 2 S/P CS for arrest of labor  S:         Reports feeling well - wants to go home today             Tolerating po intake / no nausea / no vomiting / + flatus / no BM             Bleeding is light             Pain controlled with motrin mostly             Up ad lib / ambulatory/ voiding QS  Newborn breast feeding  O:  VS: BP 105/61 mmHg  Pulse 81  Temp(Src) 97.8 F (36.6 C) (Oral)  Resp 18  Ht 5\' 5"  (1.651 m)  Wt 94.802 kg (209 lb)  BMI 34.78 kg/m2  SpO2 97%  LMP 06/26/2015  Breastfeeding? Unknown   LABS:               Recent Labs  03/29/16 1550 03/31/16 0553  WBC 13.6* 14.8*  HGB 13.3 9.2*  PLT 178 149*               Bloodtype: --/--/A POS (04/23 1551)  Rubella: Immune (10/04 0000)                                             I&O: Intake/Output      04/25 0701 - 04/26 0700 04/26 0701 - 04/27 0700   P.O.     Total Intake(mL/kg)     Urine (mL/kg/hr) 1400 (0.6)    Blood     Total Output 1400     Net -1400                      Physical Exam:             Alert and Oriented X3  Abdomen: soft, non-tender, non-distended, active BS             Fundus: firm, non-tender, U-1             Dressing intact              Incision:  approximated with suture / no erythema / no ecchymosis / no drainage  Perineum: intact  Lochia: light  Extremities: no edema, no calf pain or tenderness, negative Homans  A:        POD # 2 S/P CS for arrest of labor            Mild ABL anemia              P:        Routine postoperative care              DC home    Marlinda MikeBAILEY, Ethelean Colla CNM, MSN, [facility]ACNM 04/01/2016, 8:21 AM

## 2016-04-01 NOTE — Lactation Note (Signed)
This note was copied from a baby's chart. Lactation Consultation Note Mother is pleased with how BF is going. Anticipatory guidance and reassurance given to mother.  Encouraged support groups. Aware [facility] outpatient services.  Patient Name: Jasmine Larson ZOXWR'UToday's Date: 04/01/2016     Maternal Data    Feeding    LATCH Score/Interventions                      Lactation Tools Discussed/Used     Consult Status      Jasmine Larson, Jasmine Larson AlexandriaBoschen 04/01/2016, 11:21 AM

## 2016-11-12 LAB — OB RESULTS CONSOLE RUBELLA ANTIBODY, IGM: Rubella: IMMUNE

## 2016-11-12 LAB — OB RESULTS CONSOLE GC/CHLAMYDIA
CHLAMYDIA, DNA PROBE: NEGATIVE
Gonorrhea: NEGATIVE

## 2016-11-12 LAB — OB RESULTS CONSOLE ABO/RH: RH Type: POSITIVE

## 2016-11-12 LAB — OB RESULTS CONSOLE HEPATITIS B SURFACE ANTIGEN: Hepatitis B Surface Ag: NEGATIVE

## 2016-11-12 LAB — OB RESULTS CONSOLE RPR: RPR: NONREACTIVE

## 2016-11-12 LAB — OB RESULTS CONSOLE ANTIBODY SCREEN: ANTIBODY SCREEN: NEGATIVE

## 2016-11-12 LAB — OB RESULTS CONSOLE HIV ANTIBODY (ROUTINE TESTING): HIV: NONREACTIVE

## 2017-04-15 ENCOUNTER — Encounter (HOSPITAL_COMMUNITY): Payer: Self-pay | Admitting: *Deleted

## 2017-05-03 IMAGING — US US MFM OB DETAIL+14 WK
1 series · 14 of 28 positions shown · non-contrast
Comparison: none

[Series 1: us mfm ob detail+14 wk · 93 acquisitions, 14 frames shown]
[im 4/93]
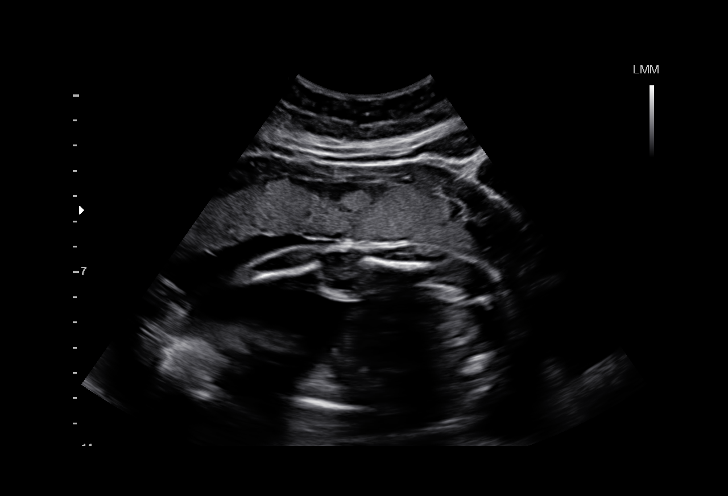
[im 11/93]
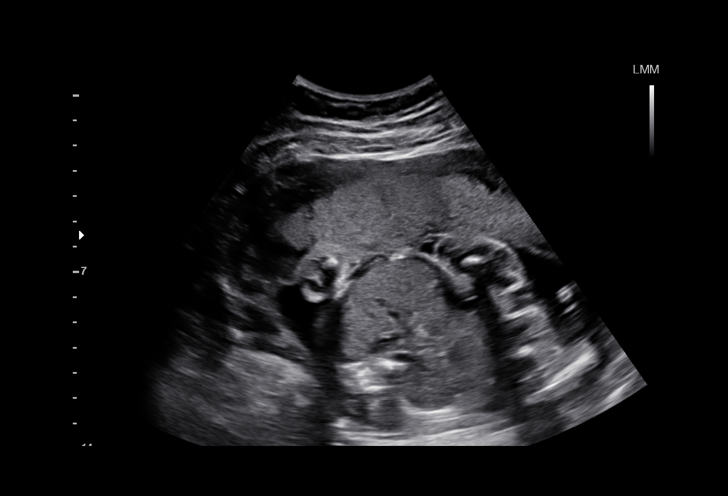
[im 18/93]
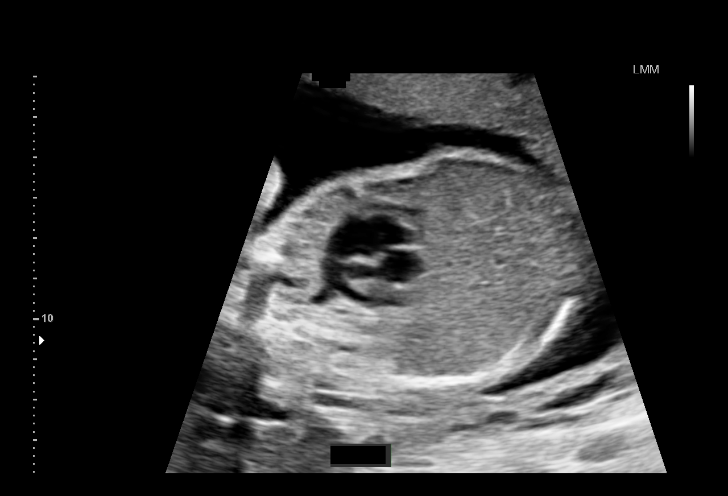
[im 24/93]
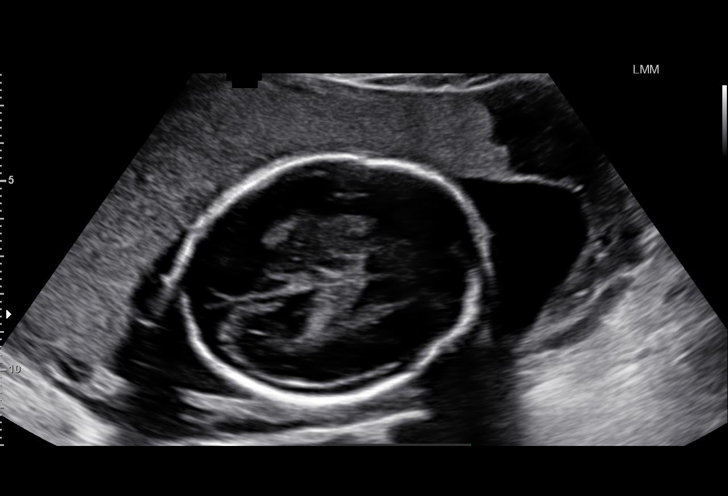
[im 31/93]
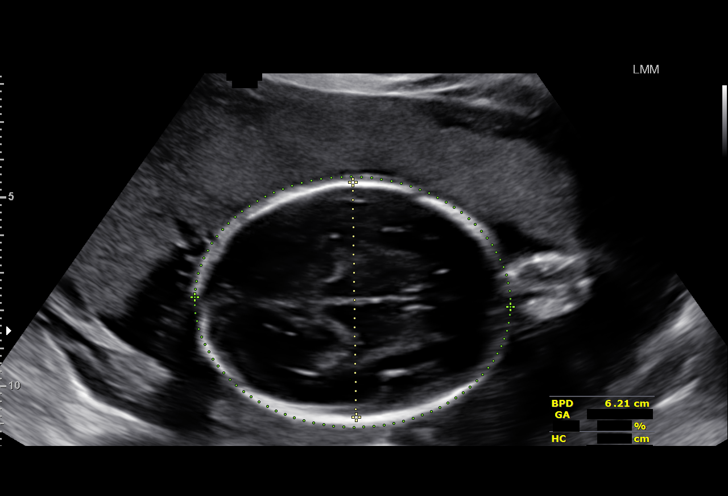
[im 38/93]
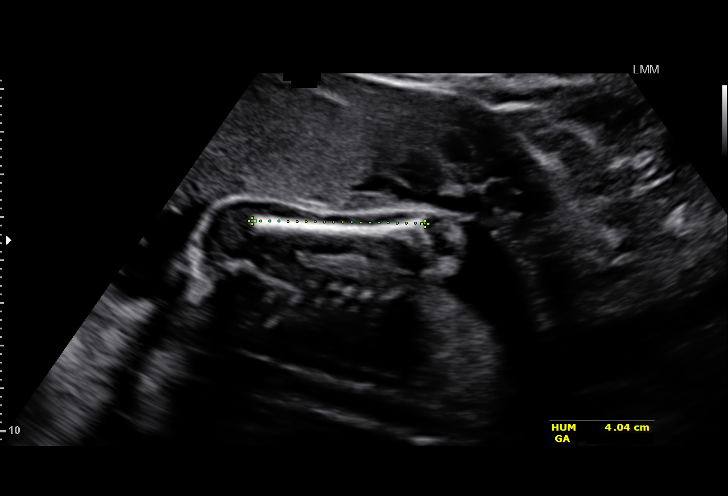
[im 45/93]
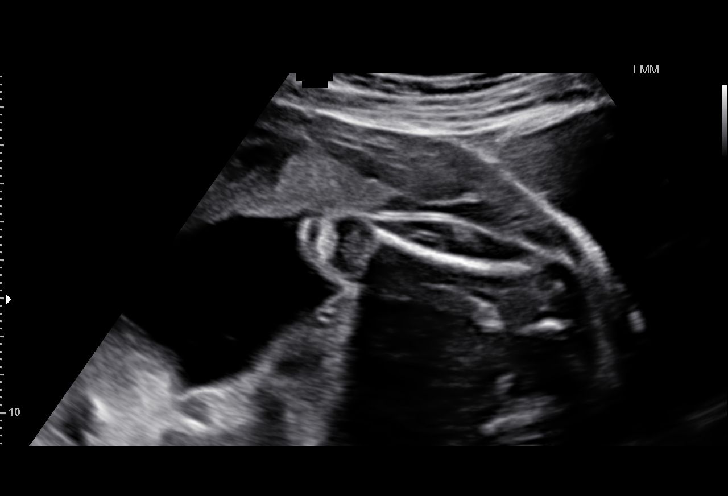
[im 52/93]
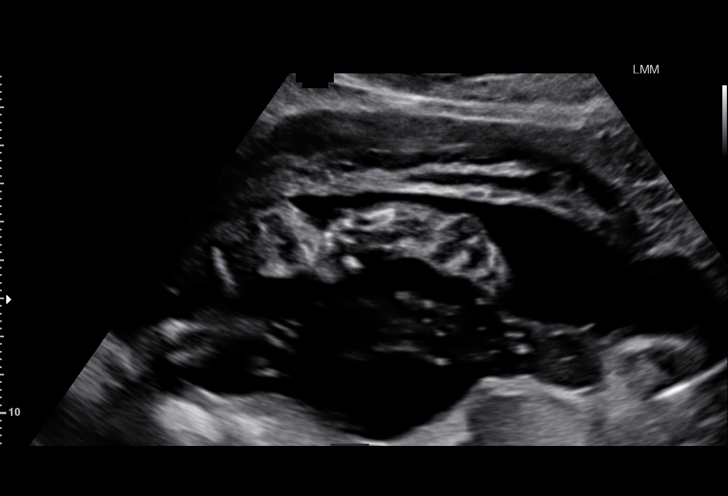
[im 58/93]
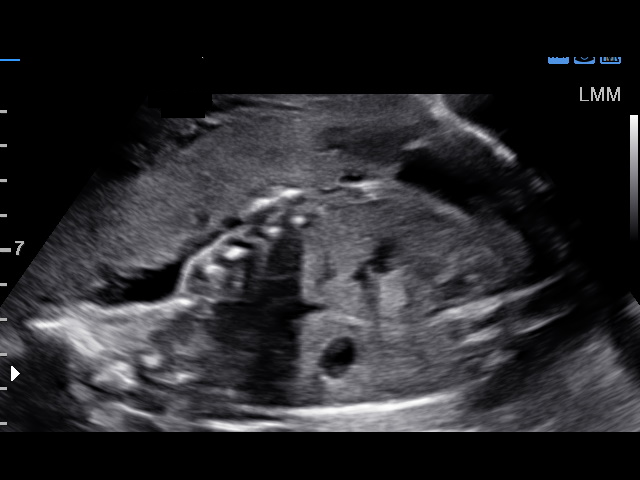
[im 65/93]
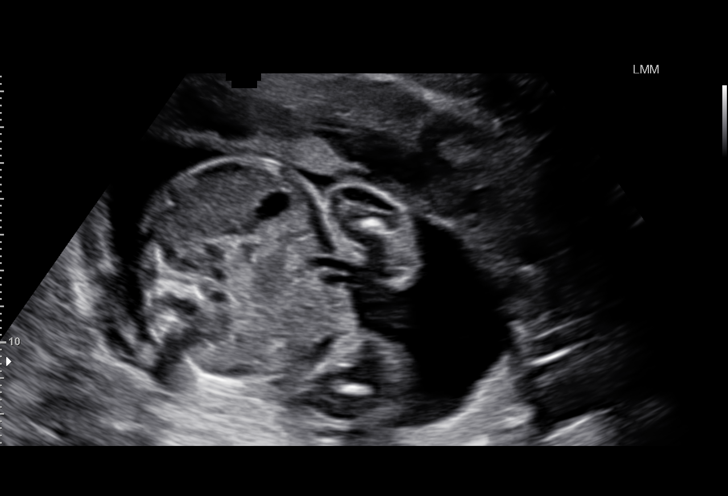
[im 72/93]
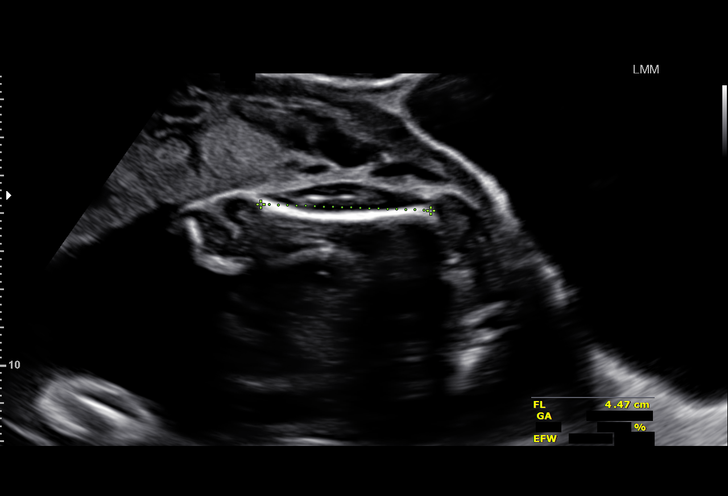
[im 79/93]
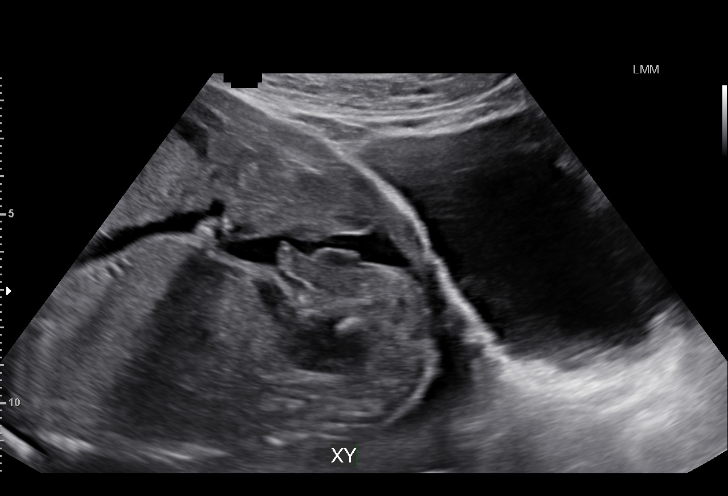
[im 86/93]
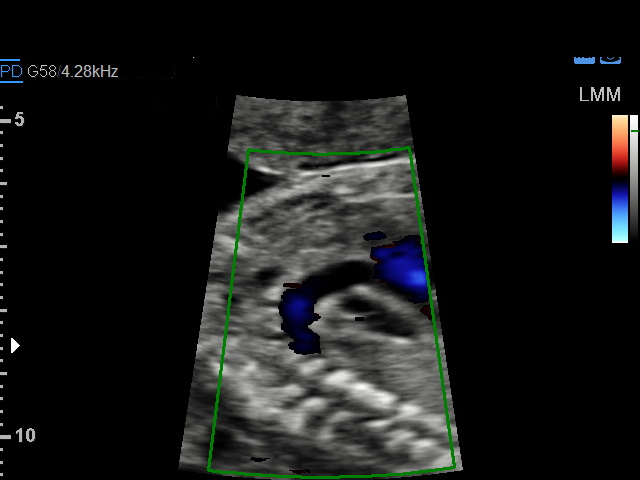
[im 93/93]
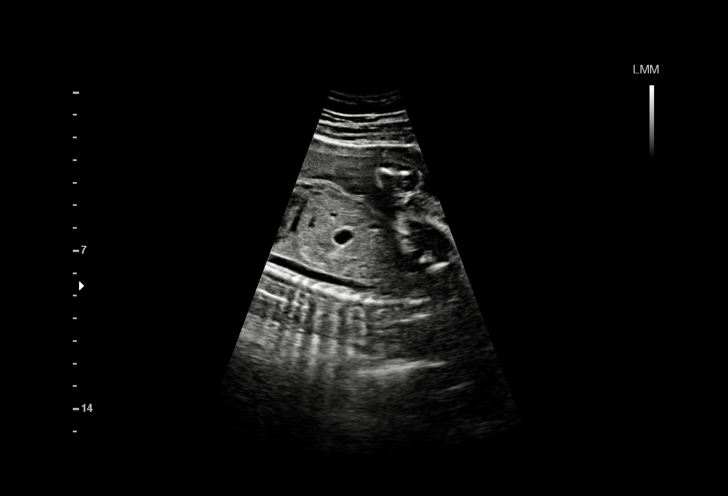

[14 of 28 positions shown; findings below may reference images not displayed]

pm)

Name:       KEJUAN SCHOBER                           Visit  12/13/2015 [DATE]
Date:

RDMS                                     [REDACTED]

1  OSTERIA OSEI           [id number]        [id number]     [id number]
Indications

Fetal abnormality - other known or
suspected (clubfoot)
24 weeks gestation of pregnancy
Detailed fetal anatomic survey                  Z36
OB History

Gravidity:     2         Term:  1
Living:        1
Fetal Evaluation

Num Of Fetuses:      1
Fetal Heart          147
Rate(bpm):
Cardiac Activity:    Observed
Presentation:        Breech
Placenta:            Anterior, above cervical os
P. Cord Insertion:   Visualized, central

Amniotic Fluid
AFI FV:      Subjectively within normal limits
Larg Pckt:      5.6  cm
Biometry

BPD:      62.2  mm     G. Age:   25w 2d                  CI:        71.05   %    70 - 86
FL/HC:      18.3   %    18.7 -
HC:      235.1  mm     G. Age:   25w 4d        77   %    HC/AC:      1.09        1.05 -
AC:      215.9  mm     G. Age:   26w 1d        89   %    FL/BPD      69.3   %    71 - 87
:
FL:       43.1  mm     G. Age:   24w 1d        32   %    FL/AC:      20.0   %    20 - 24
HUM:      40.5  mm     G. Age:   24w 4d        49   %

Est.         791   gm   1 lb 12 oz      71   %
FW:
Gestational Age

LMP:           24w 2d        Date:  06/26/15                  EDD:   04/01/16
U/S Today:     25w 2d                                         EDD:   03/25/16
Best:          24w 2d    Det. By:   LMP  (06/26/15)           EDD:   04/01/16
Anatomy

Cranium:          Appears normal         Aortic Arch:       Appears normal
Fetal Cavum:      Appears normal         Ductal Arch:       Appears normal
Ventricles:       Appears normal         Diaphragm:         Appears normal
Choroid Plexus:   Appears normal         Stomach:           Appears normal,
left sided
Cerebellum:       Appears normal         Abdomen:           Appears normal
Posterior         Appears normal         Abdominal          Appears nml (cord
Fossa:                                   Wall:              insert, abd wall)
Nuchal Fold:      Not applicable (>20    Cord Vessels:      Appears normal (3
wks GA)                                   vessel cord)
Face:             Appears normal         Kidneys:           Appear normal
(orbits and profile)
Lips:             Appears normal         Bladder:           Appears normal
Fetal Thoracic:   Appears normal         Spine:             Limited views
appear normal
Heart:            Appears normal         Upper              Appears normal
(4CH, axis, and        Extremities:
situs)
RVOT:             Appears normal         Lower              Lt. appears normal;
Extremities:       Rt appears clubbe
LVOT:             Appears normal

Other:   Parents do not wish to know sex of fetus. (Male.) Technically
difficult due to fetal position.
Cervix Uterus Adnexa

Cervix
Length:             3.8  cm.
Normal appearance by transabdominal scan.

Uterus
No abnormality visualized.

Left Ovary
Not visualized.

Right Ovary
Not visualized.

Cul De        No free fluid seen.
Sac:
Adnexa:       No abnormality visualized.
Impression

SIUP at 24+2 weeks
Unilateral club foot; mildly affected
All other detailed fetal anatomy was seen and appeared
normal
Normal amniotic fluid volume
Measurements consistent with LMP dating; EFW at the
77th %tile

The US findings were shared with Ms. Joey. The
implications of an isolated club foot were discussed in
detail.  All of her prenatal testing and pregnancy
management options were reviewed. After careful
consideration, she declined further testing (s/p low risk first
trimester screen).
Recommendations

If she decides to see a peds orthopedic surgeon prenatally,
please let us know and we'd be happy to arrange that appt
Follow-up as clinically indicated

## 2017-05-05 LAB — OB RESULTS CONSOLE GBS: STREP GROUP B AG: NEGATIVE

## 2017-05-06 ENCOUNTER — Other Ambulatory Visit: Payer: Self-pay | Admitting: Obstetrics

## 2017-05-18 ENCOUNTER — Encounter (HOSPITAL_COMMUNITY): Payer: Self-pay

## 2017-05-28 ENCOUNTER — Encounter (HOSPITAL_COMMUNITY)
Admission: RE | Admit: 2017-05-28 | Discharge: 2017-05-28 | Disposition: A | Discharge status: Home / Self Care | Payer: Managed Care, Other (non HMO) | Source: Ambulatory Visit | Attending: Obstetrics | Admitting: Obstetrics

## 2017-05-28 LAB — CBC
HCT: 39.5 % (ref 36.0–46.0)
HEMOGLOBIN: 13 g/dL (ref 12.0–15.0)
MCH: 29.1 pg (ref 26.0–34.0)
MCHC: 32.9 g/dL (ref 30.0–36.0)
MCV: 88.4 fL (ref 78.0–100.0)
Platelets: 210 10*3/uL (ref 150–400)
RBC: 4.47 MIL/uL (ref 3.87–5.11)
RDW: 13.7 % (ref 11.5–15.5)
WBC: 13.5 10*3/uL — ABNORMAL HIGH (ref 4.0–10.5)

## 2017-05-28 LAB — TYPE AND SCREEN
ABO/RH(D): A POS
Antibody Screen: NEGATIVE

## 2017-05-28 NOTE — Patient Instructions (Signed)
[address]  05/28/2017   Your procedure is scheduled on:  05/31/2017  Enter through the Main Entrance of [facility] at 0715 AM.  Pick up the phone at the desk and dial [phone].   Call this number if you have problems the morning of surgery: [phone]   Remember:   Do not eat food:After Midnight.  Do not drink clear liquids: After Midnight.  Take these medicines the morning of surgery with A SIP OF WATER: none   Do not wear jewelry, make-up or nail polish.  Do not wear lotions, powders, or perfumes. Do not wear deodorant.  Do not shave 48 hours prior to surgery.  Do not bring valuables to the hospital.  [facility] is not   responsible for any belongings or valuables brought to the hospital.  Contacts, dentures or bridgework may not be worn into surgery.  Leave suitcase in the car. After surgery it may be brought to your room.  For patients admitted to the hospital, checkout time is 11:00 AM the day of              discharge.   Patients discharged the day of surgery will not be allowed to drive             home.  Name and phone number of your driver: na  Special Instructions:   N/A   Please read over the following fact sheets that you were given:   Surgical Site Infection Prevention

## 2017-05-29 LAB — RPR: RPR: NONREACTIVE

## 2017-05-30 NOTE — H&P (Signed)
Jasmine Larson is a 31 y.o. G3P2002 at 3815w4d presenting for RCS. Pt notes occasional contractions. Good fetal movement, No vaginal bleeding, not leaking fluid.  [facility] 7 wks - unplanned but desired pregnancy. Conceive on prog only pills 6 months PP - prior PCS/ failed IOL for arrest of descent due to macrosomia- 10'3 - short interpregnancy interval - Dated by 7 wk u/s - L renal pyelectasis, 8 mm - LGA   Prenatal Transfer Tool  Maternal Diabetes: No Genetic Screening: Normal Maternal Ultrasounds/Referrals: Normal Fetal Ultrasounds or other Referrals:  None Maternal Substance Abuse:  No Significant Maternal Medications:  None Significant Maternal Lab Results: None     OB History    Gravida Para Term Preterm AB Living   3 2 2     2    SAB TAB Ectopic Multiple Live Births         0 2     No past medical history on file. Past Surgical History:  Procedure Laterality Date  . CESAREAN SECTION N/A 03/30/2016   Procedure: CESAREAN SECTION;  Surgeon: Olivia Mackieichard Taavon, MD;  Location: [facility];  Service: Obstetrics;  Laterality: N/A;  . TONSILLECTOMY    . WISDOM TOOTH EXTRACTION     Family History: family history includes Anemia in her maternal grandmother; Asthma in her sister; Diabetes in her maternal grandmother; Heart attack in her maternal grandfather; Hypertension in her maternal grandmother and mother; Stomach cancer in her maternal grandmother; Varicose Veins in her mother. Social History:  reports that she has never smoked. She has never used smokeless tobacco. She reports that she does not drink alcohol or use drugs.  Review of Systems - Negative except discomfort of pregnancy     Vitals:   05/31/17 0748  BP: 106/63  Pulse: 94  Resp: 20  Temp: 98.2 F (36.8 C)  TempSrc: Oral    Physical Exam:  Gen: well appearing, no distress  Back: no CVAT Abd: gravid, NT, no RUQ pain LE: trace edema, equal bilaterally, non-tender  CBC    Component Value  Date/Time   WBC 13.5 (H) 05/28/2017 1427   RBC 4.47 05/28/2017 1427   HGB 13.0 05/28/2017 1427   HCT 39.5 05/28/2017 1427   PLT 210 05/28/2017 1427   MCV 88.4 05/28/2017 1427   MCH 29.1 05/28/2017 1427   MCHC 32.9 05/28/2017 1427   RDW 13.7 05/28/2017 1427      Prenatal labs: ABO, Rh: --/--/A POS (06/22 1427) Antibody: NEG (06/22 1427) Rubella: !Error! immune RPR: Non Reactive (06/22 1427)  HBsAg: Negative (12/07 0000)  HIV: Non-reactive (12/07 0000)  GBS: Negative (05/30 0000)  1 hr Glucola 132  Genetic screening nl NT, nl AFP Anatomy US nl   Assessment/Plan: 31 y.o. G3P2002 at 6635w3d RCS. R/B d/w pt. Proceed as planned.    Kristinia Leavy A. 05/31/2017 9:01 AM

## 2017-05-31 ENCOUNTER — Encounter (HOSPITAL_COMMUNITY): Payer: Self-pay | Admitting: *Deleted

## 2017-05-31 ENCOUNTER — Inpatient Hospital Stay (HOSPITAL_COMMUNITY): Payer: Managed Care, Other (non HMO) | Admitting: Anesthesiology

## 2017-05-31 ENCOUNTER — Inpatient Hospital Stay (HOSPITAL_COMMUNITY)
Admission: RE | Admit: 2017-05-31 | Discharge: 2017-06-02 | DRG: 765 | Disposition: A | Discharge status: Home / Self Care | Payer: Managed Care, Other (non HMO) | Source: Ambulatory Visit | Attending: Obstetrics | Admitting: Obstetrics

## 2017-05-31 ENCOUNTER — Encounter (HOSPITAL_COMMUNITY)
Admission: RE | Disposition: A | Discharge status: Home / Self Care | Payer: Self-pay | Source: Ambulatory Visit | Attending: Obstetrics

## 2017-05-31 DIAGNOSIS — O9081 Anemia of the puerperium: Secondary | ICD-10-CM | POA: Diagnosis not present

## 2017-05-31 DIAGNOSIS — D62 Acute posthemorrhagic anemia: Secondary | ICD-10-CM | POA: Diagnosis not present

## 2017-05-31 DIAGNOSIS — Z3A39 39 weeks gestation of pregnancy: Secondary | ICD-10-CM | POA: Diagnosis not present

## 2017-05-31 DIAGNOSIS — O3663X0 Maternal care for excessive fetal growth, third trimester, not applicable or unspecified: Secondary | ICD-10-CM | POA: Diagnosis present

## 2017-05-31 DIAGNOSIS — O34211 Maternal care for low transverse scar from previous cesarean delivery: Principal | ICD-10-CM | POA: Diagnosis present

## 2017-05-31 DIAGNOSIS — Z98891 History of uterine scar from previous surgery: Secondary | ICD-10-CM

## 2017-05-31 HISTORY — DX: Pneumonia, unspecified organism: J18.9

## 2017-05-31 SURGERY — Surgical Case
Anesthesia: Spinal

## 2017-05-31 MED ORDER — SOD CITRATE-CITRIC ACID 500-334 MG/5ML PO SOLN
30.0000 mL | Freq: Once | ORAL | Status: AC
  Administered 2017-05-31: 30 mL via ORAL
  Filled 2017-05-31: qty 15

## 2017-05-31 MED ORDER — DEXTROSE 5 % IV SOLN
1.0000 ug/kg/h | INTRAVENOUS | Status: DC | PRN
  Filled 2017-05-31: qty 2

## 2017-05-31 MED ORDER — SIMETHICONE 80 MG PO CHEW
80.0000 mg | CHEWABLE_TABLET | ORAL | Status: DC
  Administered 2017-05-31 – 2017-06-01 (×2): 80 mg via ORAL
  Filled 2017-05-31 (×2): qty 1

## 2017-05-31 MED ORDER — KETOROLAC TROMETHAMINE 30 MG/ML IJ SOLN: 30.0000 mg | Freq: Four times a day (QID) | INTRAMUSCULAR | Status: AC | PRN

## 2017-05-31 MED ORDER — SENNOSIDES-DOCUSATE SODIUM 8.6-50 MG PO TABS
2.0000 | ORAL_TABLET | ORAL | Status: DC
  Administered 2017-05-31 – 2017-06-01 (×2): 2 via ORAL
  Filled 2017-05-31 (×2): qty 2

## 2017-05-31 MED ORDER — ONDANSETRON HCL 4 MG/2ML IJ SOLN
INTRAMUSCULAR | Status: DC | PRN
  Administered 2017-05-31: 4 mg via INTRAVENOUS

## 2017-05-31 MED ORDER — CEFAZOLIN SODIUM-DEXTROSE 2-4 GM/100ML-% IV SOLN
2.0000 g | INTRAVENOUS | Status: AC
  Administered 2017-05-31: 2 g via INTRAVENOUS
  Filled 2017-05-31: qty 100

## 2017-05-31 MED ORDER — DIPHENHYDRAMINE HCL 25 MG PO CAPS
25.0000 mg | ORAL_CAPSULE | ORAL | Status: DC | PRN
  Filled 2017-05-31: qty 1

## 2017-05-31 MED ORDER — DIPHENHYDRAMINE HCL 25 MG PO CAPS: 25.0000 mg | ORAL_CAPSULE | Freq: Four times a day (QID) | ORAL | Status: DC | PRN

## 2017-05-31 MED ORDER — PHENYLEPHRINE 8 MG IN D5W 100 ML (0.08MG/ML) PREMIX OPTIME
INJECTION | INTRAVENOUS | Status: AC
  Filled 2017-05-31: qty 100

## 2017-05-31 MED ORDER — TETANUS-DIPHTH-ACELL PERTUSSIS 5-2.5-18.5 LF-MCG/0.5 IM SUSP: 0.5000 mL | Freq: Once | INTRAMUSCULAR | Status: DC

## 2017-05-31 MED ORDER — FENTANYL CITRATE (PF) 100 MCG/2ML IJ SOLN
INTRAMUSCULAR | Status: DC | PRN
  Administered 2017-05-31: 10 ug via INTRATHECAL

## 2017-05-31 MED ORDER — ONDANSETRON HCL 4 MG/2ML IJ SOLN: 4.0000 mg | Freq: Three times a day (TID) | INTRAMUSCULAR | Status: DC | PRN

## 2017-05-31 MED ORDER — LACTATED RINGERS IV SOLN
INTRAVENOUS | Status: DC
  Administered 2017-05-31 (×3): via INTRAVENOUS

## 2017-05-31 MED ORDER — OXYTOCIN 10 UNIT/ML IJ SOLN
INTRAVENOUS | Status: DC | PRN
  Administered 2017-05-31: 40 [IU] via INTRAVENOUS

## 2017-05-31 MED ORDER — LACTATED RINGERS IV SOLN
INTRAVENOUS | Status: DC
  Administered 2017-05-31 – 2017-06-01 (×2): via INTRAVENOUS

## 2017-05-31 MED ORDER — MENTHOL 3 MG MT LOZG: 1.0000 | LOZENGE | OROMUCOSAL | Status: DC | PRN

## 2017-05-31 MED ORDER — MORPHINE SULFATE-NACL 0.5-0.9 MG/ML-% IV SOSY
PREFILLED_SYRINGE | INTRAVENOUS | Status: DC | PRN
  Administered 2017-05-31: .2 mg via INTRATHECAL

## 2017-05-31 MED ORDER — NALBUPHINE HCL 10 MG/ML IJ SOLN
5.0000 mg | Freq: Once | INTRAMUSCULAR | Status: AC | PRN
Start: 2017-05-31 — End: 2017-05-31

## 2017-05-31 MED ORDER — KETOROLAC TROMETHAMINE 30 MG/ML IJ SOLN
INTRAMUSCULAR | Status: AC
  Filled 2017-05-31: qty 1

## 2017-05-31 MED ORDER — MORPHINE SULFATE (PF) 4 MG/ML IV SOLN
1.0000 mg | INTRAVENOUS | Status: DC | PRN
Start: 2017-05-31 — End: 2017-05-31

## 2017-05-31 MED ORDER — SCOPOLAMINE 1 MG/3DAYS TD PT72: 1.0000 | MEDICATED_PATCH | Freq: Once | TRANSDERMAL | Status: DC

## 2017-05-31 MED ORDER — NALBUPHINE HCL 10 MG/ML IJ SOLN: 5.0000 mg | INTRAMUSCULAR | Status: DC | PRN

## 2017-05-31 MED ORDER — MEPERIDINE HCL 25 MG/ML IJ SOLN: 6.2500 mg | INTRAMUSCULAR | Status: DC | PRN

## 2017-05-31 MED ORDER — DIBUCAINE 1 % RE OINT
1.0000 | TOPICAL_OINTMENT | RECTAL | Status: DC | PRN
Start: 2017-05-31 — End: 2017-06-02

## 2017-05-31 MED ORDER — MORPHINE SULFATE (PF) 0.5 MG/ML IJ SOLN
INTRAMUSCULAR | Status: AC
  Filled 2017-05-31: qty 10

## 2017-05-31 MED ORDER — SIMETHICONE 80 MG PO CHEW
80.0000 mg | CHEWABLE_TABLET | Freq: Three times a day (TID) | ORAL | Status: DC
  Administered 2017-05-31 – 2017-06-02 (×5): 80 mg via ORAL
  Filled 2017-05-31 (×5): qty 1

## 2017-05-31 MED ORDER — IBUPROFEN 600 MG PO TABS
600.0000 mg | ORAL_TABLET | Freq: Four times a day (QID) | ORAL | Status: DC
  Administered 2017-05-31 – 2017-06-02 (×7): 600 mg via ORAL
  Filled 2017-05-31 (×7): qty 1

## 2017-05-31 MED ORDER — OXYTOCIN 10 UNIT/ML IJ SOLN
INTRAMUSCULAR | Status: AC
  Filled 2017-05-31: qty 4

## 2017-05-31 MED ORDER — ONDANSETRON HCL 4 MG/2ML IJ SOLN
INTRAMUSCULAR | Status: AC
  Filled 2017-05-31: qty 2

## 2017-05-31 MED ORDER — BUPIVACAINE IN DEXTROSE 0.75-8.25 % IT SOLN
INTRATHECAL | Status: AC
  Filled 2017-05-31: qty 2

## 2017-05-31 MED ORDER — OXYTOCIN 40 UNITS IN LACTATED RINGERS INFUSION - SIMPLE MED: 2.5000 [IU]/h | INTRAVENOUS | Status: AC

## 2017-05-31 MED ORDER — NALOXONE HCL 0.4 MG/ML IJ SOLN: 0.4000 mg | INTRAMUSCULAR | Status: DC | PRN

## 2017-05-31 MED ORDER — PHENYLEPHRINE 8 MG IN D5W 100 ML (0.08MG/ML) PREMIX OPTIME
INJECTION | INTRAVENOUS | Status: DC | PRN
  Administered 2017-05-31: 60 ug/min via INTRAVENOUS

## 2017-05-31 MED ORDER — DIPHENHYDRAMINE HCL 50 MG/ML IJ SOLN
12.5000 mg | INTRAMUSCULAR | Status: DC | PRN
  Administered 2017-05-31: 12.5 mg via INTRAVENOUS
  Filled 2017-05-31: qty 1

## 2017-05-31 MED ORDER — MIDAZOLAM HCL 2 MG/2ML IJ SOLN: 0.5000 mg | Freq: Once | INTRAMUSCULAR | Status: DC | PRN

## 2017-05-31 MED ORDER — WITCH HAZEL-GLYCERIN EX PADS: 1.0000 "application " | MEDICATED_PAD | CUTANEOUS | Status: DC | PRN

## 2017-05-31 MED ORDER — SODIUM CHLORIDE 0.9% FLUSH: 3.0000 mL | INTRAVENOUS | Status: DC | PRN

## 2017-05-31 MED ORDER — NALBUPHINE HCL 10 MG/ML IJ SOLN
5.0000 mg | Freq: Once | INTRAMUSCULAR | Status: AC | PRN
  Administered 2017-05-31: 21:00:00 via INTRAVENOUS

## 2017-05-31 MED ORDER — SCOPOLAMINE 1 MG/3DAYS TD PT72
1.0000 | MEDICATED_PATCH | Freq: Once | TRANSDERMAL | Status: DC
  Administered 2017-05-31: 1.5 mg via TRANSDERMAL
  Filled 2017-05-31: qty 1

## 2017-05-31 MED ORDER — PROMETHAZINE HCL 25 MG/ML IJ SOLN: 6.2500 mg | INTRAMUSCULAR | Status: DC | PRN

## 2017-05-31 MED ORDER — ZOLPIDEM TARTRATE 5 MG PO TABS: 5.0000 mg | ORAL_TABLET | Freq: Every evening | ORAL | Status: DC | PRN

## 2017-05-31 MED ORDER — KETOROLAC TROMETHAMINE 30 MG/ML IJ SOLN
30.0000 mg | Freq: Four times a day (QID) | INTRAMUSCULAR | Status: AC | PRN
  Administered 2017-05-31: 30 mg via INTRAMUSCULAR

## 2017-05-31 MED ORDER — ACETAMINOPHEN 325 MG PO TABS
650.0000 mg | ORAL_TABLET | ORAL | Status: DC | PRN
  Administered 2017-05-31 – 2017-06-01 (×2): 650 mg via ORAL
  Filled 2017-05-31 (×3): qty 2

## 2017-05-31 MED ORDER — COCONUT OIL OIL: 1.0000 "application " | TOPICAL_OIL | Status: DC | PRN

## 2017-05-31 MED ORDER — BUPIVACAINE IN DEXTROSE 0.75-8.25 % IT SOLN
INTRATHECAL | Status: DC | PRN
  Administered 2017-05-31: 12 mg via INTRATHECAL

## 2017-05-31 MED ORDER — FENTANYL CITRATE (PF) 100 MCG/2ML IJ SOLN
INTRAMUSCULAR | Status: AC
  Filled 2017-05-31: qty 2

## 2017-05-31 MED ORDER — NALBUPHINE HCL 10 MG/ML IJ SOLN
5.0000 mg | INTRAMUSCULAR | Status: DC | PRN
  Filled 2017-05-31: qty 1

## 2017-05-31 MED ORDER — PRENATAL MULTIVITAMIN CH
1.0000 | ORAL_TABLET | Freq: Every day | ORAL | Status: DC
  Administered 2017-06-01: 1 via ORAL
  Filled 2017-05-31: qty 1

## 2017-05-31 MED ORDER — SIMETHICONE 80 MG PO CHEW: 80.0000 mg | CHEWABLE_TABLET | ORAL | Status: DC | PRN

## 2017-05-31 SURGICAL SUPPLY — 37 items
BENZOIN TINCTURE PRP APPL 2/3 (GAUZE/BANDAGES/DRESSINGS) ×2 IMPLANT
CHLORAPREP W/TINT 26ML (MISCELLANEOUS) ×2 IMPLANT
CLAMP CORD UMBIL (MISCELLANEOUS) IMPLANT
CLOSURE STERI STRIP 1/2 X4 (GAUZE/BANDAGES/DRESSINGS) ×2 IMPLANT
CLOTH BEACON ORANGE TIMEOUT ST (SAFETY) ×2 IMPLANT
CONTAINER PREFILL 10% NBF 15ML (MISCELLANEOUS) IMPLANT
DRSG OPSITE POSTOP 4X10 (GAUZE/BANDAGES/DRESSINGS) ×2 IMPLANT
ELECT REM PT RETURN 9FT ADLT (ELECTROSURGICAL) ×2
ELECTRODE REM PT RTRN 9FT ADLT (ELECTROSURGICAL) ×1 IMPLANT
EXTRACTOR VACUUM M CUP 4 TUBE (SUCTIONS) IMPLANT
GLOVE BIO SURGEON STRL SZ 6.5 (GLOVE) ×2 IMPLANT
GLOVE BIOGEL PI IND STRL 7.0 (GLOVE) ×2 IMPLANT
GLOVE BIOGEL PI INDICATOR 7.0 (GLOVE) ×2
GOWN STRL REUS W/TWL LRG LVL3 (GOWN DISPOSABLE) ×4 IMPLANT
KIT ABG SYR 3ML LUER SLIP (SYRINGE) IMPLANT
NEEDLE HYPO 22GX1.5 SAFETY (NEEDLE) IMPLANT
NEEDLE HYPO 25X5/8 SAFETYGLIDE (NEEDLE) IMPLANT
NS IRRIG 1000ML POUR BTL (IV SOLUTION) ×2 IMPLANT
PACK C SECTION WH (CUSTOM PROCEDURE TRAY) ×2 IMPLANT
PAD OB MATERNITY 4.3X12.25 (PERSONAL CARE ITEMS) ×2 IMPLANT
PENCIL SMOKE EVAC W/HOLSTER (ELECTROSURGICAL) ×2 IMPLANT
STRIP CLOSURE SKIN 1/2X4 (GAUZE/BANDAGES/DRESSINGS) IMPLANT
SUT MON AB 4-0 PS1 27 (SUTURE) ×2 IMPLANT
SUT PLAIN 0 NONE (SUTURE) IMPLANT
SUT PLAIN 2 0 (SUTURE) ×1
SUT PLAIN 2 0 XLH (SUTURE) ×2 IMPLANT
SUT PLAIN ABS 2-0 CT1 27XMFL (SUTURE) ×1 IMPLANT
SUT VIC AB 0 CT1 27 (SUTURE) ×1
SUT VIC AB 0 CT1 27XBRD ANBCTR (SUTURE) ×1 IMPLANT
SUT VIC AB 0 CT1 36 (SUTURE) ×4 IMPLANT
SUT VIC AB 0 CTX 36 (SUTURE) ×3
SUT VIC AB 0 CTX36XBRD ANBCTRL (SUTURE) ×3 IMPLANT
SUT VIC AB 2-0 CT1 27 (SUTURE) ×1
SUT VIC AB 2-0 CT1 TAPERPNT 27 (SUTURE) ×1 IMPLANT
SYR CONTROL 10ML LL (SYRINGE) IMPLANT
TOWEL OR 17X24 6PK STRL BLUE (TOWEL DISPOSABLE) ×2 IMPLANT
TRAY FOLEY BAG SILVER LF 14FR (SET/KITS/TRAYS/PACK) IMPLANT

## 2017-05-31 NOTE — Anesthesia Procedure Notes (Signed)
Spinal  Patient location during procedure: OR End time: 05/31/2017 9:06 AM Staffing Anesthesiologist: Jairo BenJACKSON, Raveena Hebdon Performed: anesthesiologist  Preanesthetic Checklist Completed: patient identified, surgical consent, pre-op evaluation, timeout performed, IV checked, risks and benefits discussed and monitors and equipment checked Spinal Block Patient position: sitting Prep: site prepped and draped and DuraPrep Patient monitoring: blood pressure, continuous pulse ox, cardiac monitor and heart rate Approach: midline Location: L3-4 Injection technique: single-shot Needle Needle type: Pencan  Needle gauge: 24 G Needle length: 9 cm Additional Notes Pt identified in Operating room.  Monitors applied. Working IV access confirmed. Sterile prep, drape lumbar spine.  1% lido local L 3,4.  #24ga Pencan into clear CSF L 3,4.  12 mg 0.75% Bupivacaine with dextrose, fentanyl, morphine injected with asp CSF beginning and end of injection.  Patient asymptomatic, VSS, no heme aspirated, tolerated well.  Sandford Craze Kameren Baade, MD

## 2017-05-31 NOTE — Progress Notes (Signed)
Notified by RN that honeycomb dressing was coming off and noted <50% sanguinous oozing.  Order given to replace honeycomb dressing.  Advised at patient's next 1 hour check, if oozing is still noted, to apply a pressure dressing.    Carlean JewsMeredith Mellina Benison, CNM

## 2017-05-31 NOTE — Lactation Note (Signed)
This note was copied from a baby's chart. Lactation Consultation Note  Patient Name: Jasmine Larson UJWJX'BToday's Date: 05/31/2017 Reason for consult: Initial assessment  Initial visit at 5 hours of age. Mom reports inverted and flat nipples.  Right nipple appears slightly bifurcated, both breasts compressible.  Mom reports experience with 2 older children, moms 1st child was exclusively pumped and bottle fed for a few months, 2nd child latched for about 4 months then exclusively pumped additional 5 months.  Mom reports she wants to exclusively pump and bottle feed for this baby.  Mom plans to latch baby during stay in the hospital and will let staff know if DEBP set up is needed.   LC discussed using shells as needed to prevent areola edema due to IV fluids.  Mom has in room, not in use at this time.  LC provided mom with colostrum containers for hand expression and spoon feeding as desired. Mom to call for assist with spoon feedings as needed.  Baby is latched in cradle hold to right breast on and off.  Mom reports she does not like holding baby for feedings in other positions.  [facility] resources given and discussed.  Encouraged to feed with early cues on demand.  Early newborn behavior discussed.  Hand expression demonstrated with colostrum visible.  Mom to call for assist as needed.     Maternal Data Has patient been taught Hand Expression?: Yes Does the patient have breastfeeding experience prior to this delivery?: Yes  Feeding Feeding Type: Breast Fed Length of feed:  (on and off for several minutes)  LATCH Score/Interventions Latch: Repeated attempts needed to sustain latch, nipple held in mouth throughout feeding, stimulation needed to elicit sucking reflex. Intervention(s): Breast compression;Breast massage;Assist with latch  Audible Swallowing: A few with stimulation Intervention(s): Skin to skin  Type of Nipple: Flat (right slightly bifurcated, left semi flat both  compressible) Intervention(s): Shells;No intervention needed  Comfort (Breast/Nipple): Soft / non-tender     Hold (Positioning): Assistance needed to correctly position infant at breast and maintain latch. Intervention(s): Breastfeeding basics reviewed;Position options;Skin to skin  LATCH Score: 6  Lactation Tools Discussed/Used Tools: Shells (for use as needed with IV fluids)   Consult Status Consult Status: Follow-up Date: 06/01/17 Follow-up type: In-patient    Jannifer RodneyShoptaw, Bazil Dhanani Lynn 05/31/2017, 3:08 PM

## 2017-05-31 NOTE — Anesthesia Preprocedure Evaluation (Addendum)
Anesthesia Evaluation  Patient identified by MRN, date of birth, ID band Patient awake    Reviewed: Allergy & Precautions, NPO status , Patient's Chart, lab work & pertinent test results  History of Anesthesia Complications Negative for: history of anesthetic complications  Airway Mallampati: I  TM Distance: >3 FB Neck ROM: Full    Dental  (+) Dental Advisory Given   Pulmonary neg pulmonary ROS,    breath sounds clear to auscultation       Cardiovascular negative cardio ROS   Rhythm:Regular Rate:Normal     Neuro/Psych negative neurological ROS     GI/Hepatic Neg liver ROS, GERD  Controlled,  Endo/Other  negative endocrine ROS  Renal/GU negative Renal ROS     Musculoskeletal negative musculoskeletal ROS (+)   Abdominal   Peds  Hematology plt 210K   Anesthesia Other Findings   Reproductive/Obstetrics (+) Pregnancy                            Anesthesia Physical Anesthesia Plan  ASA: II  Anesthesia Plan: Spinal   Post-op Pain Management:    Induction:   PONV Risk Score and Plan: 2 and Ondansetron and Dexamethasone  Airway Management Planned: Natural Airway  Additional Equipment:   Intra-op Plan:   Post-operative Plan:   Informed Consent: I have reviewed the patients History and Physical, chart, labs and discussed the procedure including the risks, benefits and alternatives for the proposed anesthesia with the patient or authorized representative who has indicated his/her understanding and acceptance.   Dental advisory given  Plan Discussed with: CRNA and Surgeon  Anesthesia Plan Comments: (Plan routine monitors, SAB)        Anesthesia Quick Evaluation

## 2017-05-31 NOTE — Transfer of Care (Signed)
Immediate Anesthesia Transfer of Care Note  Patient: Harvie Junioraylor Towers  Procedure(s) Performed: Procedure(s) with comments: Repeat CESAREAN SECTION (N/A) - EDD: 06/03/17  Patient Location: PACU  Anesthesia Type:Spinal  Level of Consciousness: awake  Airway & Oxygen Therapy: Patient Spontanous Breathing  Post-op Assessment: Report given to RN and Post -op Vital signs reviewed and stable  Post vital signs: Reviewed and stable  Last Vitals:  Vitals:   05/31/17 0748  BP: 106/63  Pulse: 94  Resp: 20  Temp: 36.8 C    Last Pain:  Vitals:   05/31/17 0748  TempSrc: Oral      Patients Stated Pain Goal: 8 (05/31/17 0826)  Complications: No apparent anesthesia complications

## 2017-05-31 NOTE — Brief Op Note (Signed)
05/31/2017  10:11 AM  PATIENT:  Jasmine Larson  31 y.o. female  PRE-OPERATIVE DIAGNOSIS:  Previous Cesarean Section  POST-OPERATIVE DIAGNOSIS:  Previous Cesarean Section  PROCEDURE:  Procedure(s) with comments: Repeat CESAREAN SECTION (N/A) - EDD: 06/03/17  SURGEON:  Surgeon(s) and Role:    * Noland FordyceFogleman, Waleska Buttery, MD - Primary  PHYSICIAN ASSISTANT:   ASSISTANTS: Sigmon, CNM   ANESTHESIA:   spinal  EBL:  Total I/O In: 2300 [I.V.:2300] Out: 1000 [Urine:200; Blood:800]  BLOOD ADMINISTERED:none  DRAINS: Urinary Catheter (Foley)   LOCAL MEDICATIONS USED:  NONE  SPECIMEN:  Source of Specimen:  placenta  DISPOSITION OF SPECIMEN:  L&D  COUNTS:  YES  TOURNIQUET:  * No tourniquets in log *  DICTATION: .Note written in EPIC  PLAN OF CARE: Admit to inpatient   PATIENT DISPOSITION:  PACU - hemodynamically stable.   Delay start of Pharmacological VTE agent (>24hrs) due to surgical blood loss or risk of bleeding: yes

## 2017-05-31 NOTE — Op Note (Signed)
05/31/2017  10:11 AM  PATIENT:  Jasmine Larson  31 y.o. female  PRE-OPERATIVE DIAGNOSIS:  Previous Cesarean Section  POST-OPERATIVE DIAGNOSIS:  Previous Cesarean Section  PROCEDURE:  Procedure(s) with comments: Repeat CESAREAN SECTION (N/A) - EDD: 06/03/17  SURGEON:  Surgeon(s) and Role:    * Noland FordyceFogleman, Tova Vater, MD - Primary  PHYSICIAN ASSISTANT:   ASSISTANTS: Sigmon, CNM   ANESTHESIA:   spinal  EBL:  Total I/O In: 2300 [I.V.:2300] Out: 1000 [Urine:200; Blood:800]  BLOOD ADMINISTERED:none  DRAINS: Urinary Catheter (Foley)   LOCAL MEDICATIONS USED:  NONE  SPECIMEN:  Source of Specimen:  placenta  DISPOSITION OF SPECIMEN:  L&D  COUNTS:  YES  TOURNIQUET:  * No tourniquets in log *  DICTATION: .Note written in EPIC  PLAN OF CARE: Admit to inpatient   PATIENT DISPOSITION:  PACU - hemodynamically stable.   Delay start of Pharmacological VTE agent (>24hrs) due to surgical blood loss or risk of bleeding: yes     Findings:  @BABYSEXEBC @ infant,  APGAR (1 MIN): 8   APGAR (5 MINS): 9   APGAR (10 MINS):   Normal uterus, tubes and ovaries, normal placenta. 3VC, clear amniotic fluid  EBL: 800 cc Antibiotics:   2g Ancef Complications: none  Indications: This is a 31 y.o. year-old, G3P2  At 8359w4d admitted for RCS. Risks benefits and alternatives of the procedure were discussed with the patient who agreed to proceed  Procedure:  After informed consent was obtained the patient was taken to the operating room where spinal anesthesia was intiated.  She was prepped and draped in the normal sterile fashion in dorsal supine position with a leftward tilt.  A foley catheter was in place.  A Pfannenstiel skin incision was made 2 cm above the pubic symphysis in the midline with the scalpel.  Dissection was carried down with the Bovie cautery until the fascia was reached. The fascia was incised in the midline. The incision was extended laterally with the Mayo scissors. The inferior aspect  of the fascial incision was grasped with the Coker clamps, elevated up and the underlying rectus muscles were dissected off sharply. The superior aspect of the fascial incision was grasped with the Coker clamps elevated up and the underlying rectus muscles were dissected off sharply.  The peritoneum was entered bluntly. The peritoneal incision was extended superiorly and inferiorly with good visualization of the bladder. The bladder blade was inserted and palpation was done to assess the fetal position and the location of the uterine vessels. The lower segment of the uterus was incised sharply with the scalpel and extended  bluntly in the cephalo-caudal fashion. The infant was grasped, brought to the incision,  rotated and the infant was delivered with fundal pressure. The nose and mouth were bulb suctioned. The cord was clamped and cut after 1 minute delay. The infant was handed off to the waiting pediatrician. The placenta was expressed. The uterus was exteriorized. The uterus was cleared of all clots and debris. The uterine incision was repaired with 0 Vicryl in a running locked fashion.  A second layer of the same suture was used in an imbricating fashion to obtain excellent hemostasis. Several additional figure of 8 sutures were placed along the midpoint of the incision. The uterus was then returned to the abdomen, the gutters were cleared of all clots and debris. The uterine incision was reinspected and found to be hemostatic. The peritoneum was grasped and closed with 2-0 Vicryl in a running fashion. The cut muscle edges and  the underside of the fascia were inspected and found to be hemostatic. The fascia was closed with 0 Vicryl in a single layer. The subcutaneous tissue was irrigated. Scarpa's layer was closed with a 2-0 plain gut suture. The skin was closed with a 4-0 Monocryl in a single layer. The patient tolerated the procedure well. Sponge lap and needle counts were correct x3 and patient was taken to  the recovery room in a stable condition.  [name] A. 05/31/2017 10:13 AM

## 2017-05-31 NOTE — Anesthesia Postprocedure Evaluation (Signed)
Anesthesia Post Note  Patient: Jasmine Larson  Procedure(s) Performed: Procedure(s) (LRB): Repeat CESAREAN SECTION (N/A)     Patient location during evaluation: PACU Anesthesia Type: Spinal Level of consciousness: awake and alert, patient cooperative and oriented Pain management: pain level controlled Vital Signs Assessment: post-procedure vital signs reviewed and stable Respiratory status: spontaneous breathing, nonlabored ventilation and respiratory function stable Cardiovascular status: blood pressure returned to baseline and stable Postop Assessment: patient able to bend at knees and spinal receding Anesthetic complications: no    Last Vitals:  Vitals:   05/31/17 1132 05/31/17 1158  BP: 114/69   Pulse: 64 72  Resp: 17 20  Temp:      Last Pain:  Vitals:   05/31/17 1045  TempSrc: Oral   Pain Goal: Patients Stated Pain Goal: 0 (05/31/17 1100)               Zaia Carre,E. Sarea Fyfe

## 2017-06-01 LAB — CBC
HCT: 34.1 % — ABNORMAL LOW (ref 36.0–46.0)
Hemoglobin: 11.4 g/dL — ABNORMAL LOW (ref 12.0–15.0)
MCH: 29.2 pg (ref 26.0–34.0)
MCHC: 33.4 g/dL (ref 30.0–36.0)
MCV: 87.4 fL (ref 78.0–100.0)
PLATELETS: 191 10*3/uL (ref 150–400)
RBC: 3.9 MIL/uL (ref 3.87–5.11)
RDW: 13.8 % (ref 11.5–15.5)
WBC: 13.4 10*3/uL — AB (ref 4.0–10.5)

## 2017-06-01 LAB — BIRTH TISSUE RECOVERY COLLECTION (PLACENTA DONATION)

## 2017-06-01 MED ORDER — OXYCODONE-ACETAMINOPHEN 5-325 MG PO TABS: 2.0000 | ORAL_TABLET | ORAL | Status: DC | PRN

## 2017-06-01 MED ORDER — OXYCODONE-ACETAMINOPHEN 5-325 MG PO TABS
1.0000 | ORAL_TABLET | ORAL | Status: DC | PRN
Start: 2017-06-01 — End: 2017-06-02
  Administered 2017-06-01: 1 via ORAL
  Filled 2017-06-01: qty 1

## 2017-06-01 NOTE — Lactation Note (Signed)
This note was copied from a baby's chart. Lactation Consultation Note  Patient Name: [name] WUJWJ'XToday's Date: 06/01/2017  Mom has decided to pump and bottle feed as she did with her previous babies.  RN initiated symphony pump and mom pumped 10 mls with first pumping.  She has formula in room if needed for more volume.  Mom has a DEBP at home.  Encouraged to call out with concerns prn.   Maternal Data    Feeding Feeding Type: Breast Fed Length of feed: 12 min  LATCH Score/Interventions Latch:  (instructed MOB to call for next latch)                    Lactation Tools Discussed/Used     Consult Status      Huston FoleyMOULDEN, Cadin Luka S 06/01/2017, 6:33 PM

## 2017-06-01 NOTE — Progress Notes (Signed)
POSTOPERATIVE DAY # 1 S/P Repeat LTCS   S:         Reports feeling great with minimal discomfort             Tolerating po intake / no nausea / no vomiting / + flatus and belching / no BM  Denies SOB, CP             Bleeding is light             Pain controlled with Motrin and Tylenol             Up ad lib / ambulatory/ voiding QS  Newborn breast feeding - going well / Circumcision - planning today with Dr. Ernestina PennaFogleman    O:  VS: BP (!) 100/52   Pulse 82   Temp 98 F (36.7 C)   Resp 18   Ht 5\' 6"  (1.676 m)   Wt 90.3 kg (199 lb)   SpO2 96%   BMI 32.12 kg/m    LABS:               Recent Labs  06/01/17 0652  WBC 13.4*  HGB 11.4*  PLT 191               Bloodtype: --/--/A POS (06/22 1427)  Rubella: Immune (12/07 0000)                                             I&O: Intake/Output      06/25 0701 - 06/26 0700 06/26 0701 - 06/27 0700   P.O. 1960    I.V. (mL/kg) 2550 (28.2)    Other 350    Total Intake(mL/kg) 4860 (53.8)    Urine (mL/kg/hr) 2375 400 (1.8)   Blood 810    Total Output 3185 400   Net +1675 -400                     Physical Exam:             Alert and Oriented X3  Lungs: Clear and unlabored  Heart: regular rate and rhythm / no mumurs  Abdomen: soft, non-tender, non-distended, hypoactive bowel sounds in all quadrants             Fundus: firm, non-tender, U-1             Dressing honeycomb dsg with steri-strips c/d/i              Incision:  approximated with sutures / no erythema / no ecchymosis / no drainage  Perineum: intact  Lochia: appropriate, no clots  Extremities: no edema, no calf pain or tenderness  A:        POD # 1 S/P Repeat LTCS            Mild ABL Anemia - stable, asymptomatic   P:        Routine postoperative care              See lactation today  May shower and ambulate in halls today  Plan for circ today   Anticipate discharge home tomorrow  Carlean JewsMeredith Sigmon, MSN, CNM [facility]

## 2017-06-01 NOTE — Plan of Care (Signed)
Problem: Activity: Goal: Ability to tolerate increased activity will improve Outcome: Completed/Met Date Met: 06/01/17 Pt ambulating frequently and independently in the room with little to no pain.  Encouraged pt to ambulate in the hallway. Pt verbalized understanding.  Problem: Nutritional: Goal: Mothers verbalization of comfort with breastfeeding process will improve Outcome: Progressing Pt verbalizes comfort with breastfeeding.  Cluster feeding and waking techniques discussed.  Pt denies nipple soreness but linear stripe noted on the left nipple.  Pt demonstrates ability to latch infant with minimal difficulty and frequent swallows heard.  It was noted that baby latches on quickly but falls asleep at the breast within a minute or two.  With rubbing behind the ear, baby did wake and swallow more but stimulation was frequently required.  Pt verbalized understanding.

## 2017-06-02 MED ORDER — IBUPROFEN 600 MG PO TABS
600.0000 mg | ORAL_TABLET | Freq: Four times a day (QID) | ORAL | 0 refills | Status: AC
Start: 2017-06-02 — End: ?

## 2017-06-02 MED ORDER — TRAMADOL HCL 50 MG PO TABS: 50.0000 mg | ORAL_TABLET | Freq: Four times a day (QID) | ORAL | 0 refills | Status: AC | PRN

## 2017-06-02 NOTE — Discharge Summary (Signed)
Obstetric Discharge Summary Reason for Admission: cesarean section - scheduled repeat Prenatal Procedures: none Intrapartum Procedures: repeat cesarean section performed by Dr Ernestina PennaFogleman                                                      see OP note for details  Postpartum Procedures: none Complications-Operative and Postpartum: none Hemoglobin  Date Value Ref Range Status  06/01/2017 11.4 (L) 12.0 - 15.0 g/dL Final   HCT  Date Value Ref Range Status  06/01/2017 34.1 (L) 36.0 - 46.0 % Final    Physical Exam:  General: alert, cooperative and no distress Lochia: appropriate Uterine Fundus: firm Incision: healing well DVT Evaluation: No evidence of DVT seen on physical exam.   Discharge Diagnoses: Term Pregnancy-delivered by cesarean section - repeat                                           Discharge Information: Date: 06/02/2017 Activity: pelvic rest and activity restriction of no driving car x 2 weeks / no exercise x 6 week Diet: routine  Incision: remove dressing day 5 after surgery / keep incision clean and dry / remove tape strips in 2 weeks / call if any redness, swelling, drainage from site Medications: PNV, Ibuprofen and tramadol Condition: stable Instructions: refer to practice specific booklet Discharge to: home Follow-up Information    Noland FordyceFogleman, Kelly, MD. Schedule an appointment as soon as possible for a visit in 6 week(s).   Specialty:  Obstetrics and Gynecology Contact information: [address] [place] [id number] [phone]           Newborn Data: Live born female  Birth Weight: 10 lb 10.2 oz (4825 g) APGAR: 8, 9  Home with mother.  Marlinda MikeBAILEY, Shayda Kalka 06/02/2017, 10:34 AM

## 2017-06-02 NOTE — Progress Notes (Signed)
POSTOPERATIVE DAY # 2 S/P Repeat C/S   S:         Reports feeling great- ready to go home, baby has already been discharged              Tolerating po intake / no nausea / no vomiting / + flatus / no BM             Bleeding is light             Pain controlled with ibuprofen (OTC)             Up ad lib / ambulatory/ voiding QS  Newborn breast feeding- exclusively pumping, desires to pump at home  / Circumcision completed yesterday    O:  VS: BP (!) 97/55 (BP Location: Right Arm)   Pulse 65   Temp 98.1 F (36.7 C) (Oral)   Resp 18   Ht 5\' 6"  (1.676 m)   Wt 90.3 kg (199 lb)   SpO2 99%   BMI 32.12 kg/m    LABS:               Recent Labs  06/01/17 0652  WBC 13.4*  HGB 11.4*  PLT 191               Bloodtype: --/--/A POS (06/22 1427)  Rubella: Immune (12/07 0000)                                             I&O: Intake/Output      06/26 0701 - 06/27 0700 06/27 0701 - 06/28 0700   P.O.     I.V. (mL/kg)     Other     Total Intake(mL/kg)     Urine (mL/kg/hr) 1100 (0.5)    Blood     Total Output 1100     Net -1100                       Physical Exam:             Alert and Oriented X3  Abdomen: soft, non-tender, non-distended              Fundus: firm, non-tender, U/2             Dressing clean dry intact               Incision:  approximated with sutures / no erythema / no ecchymosis / no drainage  Perineum: intact  Lochia: light  Extremities: no Homans sign, no edema, no calf pain or tenderness  A:        POD # 2 S/P repeat C/S            Doing well stable status   P:        DC home today   Follow up in office 6 week PP   [facility] packet discharge instructions given and reviewed   Education on care of wound and removal of honeycomb dressing on Saturday   Discussed warning s/s to call if signs of infection    Jasmine Larson BSN, SNM  06/02/2017, 10:43 AM

## 2017-06-02 NOTE — Progress Notes (Signed)
CSW received consult for hx of Anxiety and Depression.  CSW met with MOB to offer support and complete assessment.   MOB reports feeling well emotionally after her last two deliveries.  Her mother was in the room and MOB asked her mother what she thought about MOB's emotional state during her first two postpartum periods and MGM states she thinks MOB might have had a little baby blues after her first child, but does not recall any significant concerns.  MOB denies any hx of Anx/Dep outside of the postpartum time periods also.   CSW provided education regarding the baby blues period vs. perinatal mood disorders. CSW recommends self-evaluation during the postpartum time period using the New Mom Checklist from [employer]partum [employer] and encouraged MOB to contact a medical professional if symptoms are noted at any time.   CSW identifies no further need for intervention and no barriers to discharge at this time.

## 2017-10-11 ENCOUNTER — Encounter (HOSPITAL_COMMUNITY): Payer: Self-pay

## 2017-12-09 DIAGNOSIS — R1011 Right upper quadrant pain: Secondary | ICD-10-CM | POA: Diagnosis not present

## 2017-12-22 DIAGNOSIS — R1011 Right upper quadrant pain: Secondary | ICD-10-CM | POA: Diagnosis not present

## 2018-01-04 DIAGNOSIS — R1011 Right upper quadrant pain: Secondary | ICD-10-CM | POA: Diagnosis not present

## 2018-01-05 DIAGNOSIS — R1011 Right upper quadrant pain: Secondary | ICD-10-CM | POA: Diagnosis not present

## 2018-01-05 DIAGNOSIS — R10816 Epigastric abdominal tenderness: Secondary | ICD-10-CM | POA: Diagnosis not present

## 2018-01-05 DIAGNOSIS — R110 Nausea: Secondary | ICD-10-CM | POA: Diagnosis not present

## 2018-01-05 DIAGNOSIS — Z8719 Personal history of other diseases of the digestive system: Secondary | ICD-10-CM | POA: Diagnosis not present

## 2018-01-12 DIAGNOSIS — R1013 Epigastric pain: Secondary | ICD-10-CM | POA: Diagnosis not present

## 2018-01-18 DIAGNOSIS — K3189 Other diseases of stomach and duodenum: Secondary | ICD-10-CM | POA: Diagnosis not present

## 2018-01-18 DIAGNOSIS — R1011 Right upper quadrant pain: Secondary | ICD-10-CM | POA: Diagnosis not present

## 2018-01-18 DIAGNOSIS — R1013 Epigastric pain: Secondary | ICD-10-CM | POA: Diagnosis not present

## 2018-01-18 DIAGNOSIS — Z79899 Other long term (current) drug therapy: Secondary | ICD-10-CM | POA: Diagnosis not present

## 2018-01-18 DIAGNOSIS — K219 Gastro-esophageal reflux disease without esophagitis: Secondary | ICD-10-CM | POA: Diagnosis not present

## 2018-01-27 DIAGNOSIS — K900 Celiac disease: Secondary | ICD-10-CM | POA: Diagnosis not present

## 2018-02-02 DIAGNOSIS — Z8719 Personal history of other diseases of the digestive system: Secondary | ICD-10-CM | POA: Diagnosis not present

## 2018-02-02 DIAGNOSIS — R110 Nausea: Secondary | ICD-10-CM | POA: Diagnosis not present

## 2018-02-02 DIAGNOSIS — R10816 Epigastric abdominal tenderness: Secondary | ICD-10-CM | POA: Diagnosis not present

## 2018-02-02 DIAGNOSIS — R1011 Right upper quadrant pain: Secondary | ICD-10-CM | POA: Diagnosis not present

## 2018-06-02 DIAGNOSIS — L299 Pruritus, unspecified: Secondary | ICD-10-CM | POA: Diagnosis not present

## 2018-06-02 DIAGNOSIS — R21 Rash and other nonspecific skin eruption: Secondary | ICD-10-CM | POA: Diagnosis not present

## 2018-09-20 DIAGNOSIS — R1011 Right upper quadrant pain: Secondary | ICD-10-CM | POA: Diagnosis not present

## 2018-09-20 DIAGNOSIS — R110 Nausea: Secondary | ICD-10-CM | POA: Diagnosis not present

## 2018-09-20 DIAGNOSIS — R10816 Epigastric abdominal tenderness: Secondary | ICD-10-CM | POA: Diagnosis not present

## 2018-09-20 DIAGNOSIS — Z8719 Personal history of other diseases of the digestive system: Secondary | ICD-10-CM | POA: Diagnosis not present

## 2018-09-21 DIAGNOSIS — L710 Perioral dermatitis: Secondary | ICD-10-CM | POA: Diagnosis not present

## 2018-09-26 DIAGNOSIS — K828 Other specified diseases of gallbladder: Secondary | ICD-10-CM | POA: Diagnosis not present

## 2018-09-26 DIAGNOSIS — K811 Chronic cholecystitis: Secondary | ICD-10-CM | POA: Diagnosis not present

## 2018-09-26 DIAGNOSIS — K915 Postcholecystectomy syndrome: Secondary | ICD-10-CM | POA: Diagnosis not present

## 2018-11-01 DIAGNOSIS — L710 Perioral dermatitis: Secondary | ICD-10-CM | POA: Diagnosis not present

## 2019-01-25 DIAGNOSIS — M4316 Spondylolisthesis, lumbar region: Secondary | ICD-10-CM | POA: Diagnosis not present

## 2019-02-13 DIAGNOSIS — M4316 Spondylolisthesis, lumbar region: Secondary | ICD-10-CM | POA: Diagnosis not present

## 2019-02-13 DIAGNOSIS — M545 Low back pain: Secondary | ICD-10-CM | POA: Diagnosis not present

## 2019-02-14 DIAGNOSIS — M4316 Spondylolisthesis, lumbar region: Secondary | ICD-10-CM | POA: Diagnosis not present

## 2020-05-08 DIAGNOSIS — S80861A Insect bite (nonvenomous), right lower leg, initial encounter: Secondary | ICD-10-CM | POA: Diagnosis not present

## 2020-05-08 DIAGNOSIS — W57XXXA Bitten or stung by nonvenomous insect and other nonvenomous arthropods, initial encounter: Secondary | ICD-10-CM | POA: Diagnosis not present

## 2020-07-15 DIAGNOSIS — Z130 Encounter for screening for diseases of the blood and blood-forming organs and certain disorders involving the immune mechanism: Secondary | ICD-10-CM | POA: Diagnosis not present

## 2020-07-15 DIAGNOSIS — Z0000 Encounter for general adult medical examination without abnormal findings: Secondary | ICD-10-CM | POA: Diagnosis not present

## 2020-07-15 DIAGNOSIS — Z124 Encounter for screening for malignant neoplasm of cervix: Secondary | ICD-10-CM | POA: Diagnosis not present

## 2020-07-15 DIAGNOSIS — Z1151 Encounter for screening for human papillomavirus (HPV): Secondary | ICD-10-CM | POA: Diagnosis not present

## 2020-07-15 DIAGNOSIS — Z6831 Body mass index (BMI) 31.0-31.9, adult: Secondary | ICD-10-CM | POA: Diagnosis not present

## 2020-07-15 DIAGNOSIS — Z13220 Encounter for screening for lipoid disorders: Secondary | ICD-10-CM | POA: Diagnosis not present

## 2020-07-15 DIAGNOSIS — Z1329 Encounter for screening for other suspected endocrine disorder: Secondary | ICD-10-CM | POA: Diagnosis not present

## 2020-07-15 DIAGNOSIS — Z131 Encounter for screening for diabetes mellitus: Secondary | ICD-10-CM | POA: Diagnosis not present

## 2020-07-15 DIAGNOSIS — Z01419 Encounter for gynecological examination (general) (routine) without abnormal findings: Secondary | ICD-10-CM | POA: Diagnosis not present
